# Patient Record
Sex: Female | Born: 1935 | Race: White | Hispanic: No | Marital: Single | Smoking: Never smoker
Health system: Southern US, Community
[De-identification: ages and names within clinical notes are randomized; demographics above are authoritative.]

## PROBLEM LIST (undated history)

## (undated) DIAGNOSIS — J449 Chronic obstructive pulmonary disease, unspecified: Secondary | ICD-10-CM

## (undated) DIAGNOSIS — R739 Hyperglycemia, unspecified: Secondary | ICD-10-CM

## (undated) DIAGNOSIS — D649 Anemia, unspecified: Secondary | ICD-10-CM

## (undated) DIAGNOSIS — J45909 Unspecified asthma, uncomplicated: Secondary | ICD-10-CM

## (undated) DIAGNOSIS — F0390 Unspecified dementia without behavioral disturbance: Secondary | ICD-10-CM

## (undated) DIAGNOSIS — I10 Essential (primary) hypertension: Secondary | ICD-10-CM

## (undated) DIAGNOSIS — M1990 Unspecified osteoarthritis, unspecified site: Secondary | ICD-10-CM

## (undated) DIAGNOSIS — H3530 Unspecified macular degeneration: Secondary | ICD-10-CM

## (undated) HISTORY — PX: BACK SURGERY: SHX140

## (undated) HISTORY — PX: CARPAL TUNNEL RELEASE: SHX101

## (undated) HISTORY — PX: LUNG SURGERY: SHX703

## (undated) HISTORY — PX: ABDOMINAL HYSTERECTOMY: SHX81

---

## 2004-07-11 ENCOUNTER — Ambulatory Visit: Payer: Self-pay | Admitting: Pain Medicine

## 2004-08-14 ENCOUNTER — Ambulatory Visit: Payer: Self-pay | Admitting: Internal Medicine

## 2004-08-15 ENCOUNTER — Ambulatory Visit: Payer: Self-pay | Admitting: Internal Medicine

## 2004-10-14 ENCOUNTER — Emergency Department: Payer: Self-pay | Admitting: Emergency Medicine

## 2004-10-25 ENCOUNTER — Emergency Department: Payer: Self-pay | Admitting: Emergency Medicine

## 2004-10-31 ENCOUNTER — Ambulatory Visit (HOSPITAL_BASED_OUTPATIENT_CLINIC_OR_DEPARTMENT_OTHER): Admission: RE | Admit: 2004-10-31 | Discharge: 2004-10-31 | Payer: Self-pay | Admitting: Urology

## 2004-10-31 ENCOUNTER — Ambulatory Visit (HOSPITAL_COMMUNITY): Admission: RE | Admit: 2004-10-31 | Discharge: 2004-10-31 | Payer: Self-pay | Admitting: Urology

## 2004-12-08 ENCOUNTER — Ambulatory Visit: Payer: Self-pay | Admitting: Internal Medicine

## 2004-12-13 ENCOUNTER — Ambulatory Visit: Payer: Self-pay | Admitting: Internal Medicine

## 2004-12-13 ENCOUNTER — Ambulatory Visit (HOSPITAL_COMMUNITY): Admission: RE | Admit: 2004-12-13 | Discharge: 2004-12-13 | Payer: Self-pay | Admitting: Internal Medicine

## 2004-12-21 ENCOUNTER — Ambulatory Visit: Payer: Self-pay | Admitting: Internal Medicine

## 2005-01-09 ENCOUNTER — Ambulatory Visit: Payer: Self-pay | Admitting: Internal Medicine

## 2005-06-17 ENCOUNTER — Emergency Department: Payer: Self-pay | Admitting: Emergency Medicine

## 2005-11-08 ENCOUNTER — Ambulatory Visit: Payer: Self-pay | Admitting: Specialist

## 2006-05-08 ENCOUNTER — Ambulatory Visit: Payer: Self-pay | Admitting: Specialist

## 2006-07-18 ENCOUNTER — Ambulatory Visit: Payer: Self-pay | Admitting: Neurology

## 2006-07-18 ENCOUNTER — Ambulatory Visit: Payer: Self-pay | Admitting: Internal Medicine

## 2007-07-23 ENCOUNTER — Ambulatory Visit: Payer: Self-pay | Admitting: Internal Medicine

## 2007-09-30 ENCOUNTER — Ambulatory Visit: Payer: Self-pay | Admitting: Internal Medicine

## 2007-10-01 ENCOUNTER — Ambulatory Visit: Payer: Self-pay | Admitting: Internal Medicine

## 2008-05-01 ENCOUNTER — Ambulatory Visit: Payer: Self-pay | Admitting: Internal Medicine

## 2008-05-13 ENCOUNTER — Ambulatory Visit: Payer: Self-pay | Admitting: Internal Medicine

## 2008-06-01 ENCOUNTER — Ambulatory Visit: Payer: Self-pay | Admitting: Internal Medicine

## 2008-07-01 ENCOUNTER — Ambulatory Visit: Payer: Self-pay | Admitting: Internal Medicine

## 2008-08-01 ENCOUNTER — Ambulatory Visit: Payer: Self-pay | Admitting: Internal Medicine

## 2008-11-25 ENCOUNTER — Ambulatory Visit: Payer: Self-pay | Admitting: Internal Medicine

## 2008-12-13 ENCOUNTER — Ambulatory Visit: Payer: Self-pay | Admitting: Internal Medicine

## 2009-12-06 ENCOUNTER — Ambulatory Visit: Payer: Self-pay | Admitting: Internal Medicine

## 2011-01-23 ENCOUNTER — Ambulatory Visit: Payer: Self-pay | Admitting: Internal Medicine

## 2011-10-23 ENCOUNTER — Ambulatory Visit: Payer: Self-pay | Admitting: Internal Medicine

## 2012-06-12 ENCOUNTER — Ambulatory Visit: Payer: Self-pay | Admitting: Internal Medicine

## 2013-03-14 ENCOUNTER — Emergency Department: Payer: Self-pay | Admitting: Emergency Medicine

## 2013-03-14 LAB — URINALYSIS, COMPLETE
Bilirubin,UR: NEGATIVE
Blood: NEGATIVE
Nitrite: NEGATIVE
Ph: 6 (ref 4.5–8.0)
Specific Gravity: 1.014 (ref 1.003–1.030)
Squamous Epithelial: NONE SEEN
WBC UR: 2 /HPF (ref 0–5)

## 2013-03-14 LAB — COMPREHENSIVE METABOLIC PANEL
Bilirubin,Total: 0.3 mg/dL (ref 0.2–1.0)
Calcium, Total: 8.7 mg/dL (ref 8.5–10.1)
Creatinine: 0.89 mg/dL (ref 0.60–1.30)
EGFR (African American): >60
EGFR (Non-African Amer.): >60
Sodium: 138 mmol/L (ref 136–145)

## 2013-03-14 LAB — TROPONIN I: Troponin-I: <0.02 ng/mL

## 2013-03-14 LAB — CBC: MCV: 85 fL (ref 80–100)

## 2013-03-27 ENCOUNTER — Encounter: Payer: Self-pay | Admitting: Unknown Physician Specialty

## 2013-07-09 ENCOUNTER — Ambulatory Visit: Payer: Self-pay | Admitting: Internal Medicine

## 2014-06-07 ENCOUNTER — Ambulatory Visit: Payer: Self-pay | Admitting: Internal Medicine

## 2014-06-07 LAB — URINALYSIS, COMPLETE
Bilirubin,UR: NEGATIVE
GLUCOSE, UR: NEGATIVE
KETONE: NEGATIVE
Nitrite: POSITIVE
PH: 5.5 (ref 5.0–8.0)
Protein: 100
SPECIFIC GRAVITY: 1.025 (ref 1.000–1.030)

## 2014-06-10 LAB — URINE CULTURE

## 2014-10-25 ENCOUNTER — Ambulatory Visit: Payer: Self-pay

## 2014-10-25 LAB — URINALYSIS, COMPLETE
Bacteria: NEGATIVE
Bilirubin,UR: NEGATIVE
Blood: NEGATIVE
Glucose,UR: NEGATIVE
KETONE: NEGATIVE
NITRITE: NEGATIVE
PH: 5.5 (ref 5.0–8.0)
PROTEIN: NEGATIVE
Specific Gravity: 1.01 (ref 1.000–1.030)

## 2014-10-26 ENCOUNTER — Ambulatory Visit: Payer: Self-pay | Admitting: Internal Medicine

## 2014-10-27 LAB — URINE CULTURE

## 2015-06-11 ENCOUNTER — Ambulatory Visit
Admission: EM | Admit: 2015-06-11 | Discharge: 2015-06-11 | Disposition: A | Discharge status: Home / Self Care | Payer: Medicare Other | Attending: Family Medicine | Admitting: Family Medicine

## 2015-06-11 ENCOUNTER — Encounter: Payer: Self-pay | Admitting: Gynecology

## 2015-06-11 DIAGNOSIS — F0390 Unspecified dementia without behavioral disturbance: Secondary | ICD-10-CM

## 2015-06-11 DIAGNOSIS — J40 Bronchitis, not specified as acute or chronic: Secondary | ICD-10-CM | POA: Diagnosis not present

## 2015-06-11 DIAGNOSIS — J0100 Acute maxillary sinusitis, unspecified: Secondary | ICD-10-CM

## 2015-06-11 DIAGNOSIS — H65192 Other acute nonsuppurative otitis media, left ear: Secondary | ICD-10-CM | POA: Diagnosis not present

## 2015-06-11 DIAGNOSIS — N390 Urinary tract infection, site not specified: Secondary | ICD-10-CM | POA: Diagnosis not present

## 2015-06-11 DIAGNOSIS — H66001 Acute suppurative otitis media without spontaneous rupture of ear drum, right ear: Secondary | ICD-10-CM | POA: Diagnosis not present

## 2015-06-11 DIAGNOSIS — J45901 Unspecified asthma with (acute) exacerbation: Secondary | ICD-10-CM | POA: Insufficient documentation

## 2015-06-11 DIAGNOSIS — H6592 Unspecified nonsuppurative otitis media, left ear: Secondary | ICD-10-CM

## 2015-06-11 DIAGNOSIS — R05 Cough: Secondary | ICD-10-CM | POA: Diagnosis present

## 2015-06-11 DIAGNOSIS — J4541 Moderate persistent asthma with (acute) exacerbation: Secondary | ICD-10-CM

## 2015-06-11 HISTORY — DX: Unspecified osteoarthritis, unspecified site: M19.90

## 2015-06-11 HISTORY — DX: Hyperglycemia, unspecified: R73.9

## 2015-06-11 HISTORY — DX: Unspecified asthma, uncomplicated: J45.909

## 2015-06-11 HISTORY — DX: Essential (primary) hypertension: I10

## 2015-06-11 HISTORY — DX: Anemia, unspecified: D64.9

## 2015-06-11 HISTORY — DX: Unspecified dementia, unspecified severity, without behavioral disturbance, psychotic disturbance, mood disturbance, and anxiety: F03.90

## 2015-06-11 HISTORY — DX: Chronic obstructive pulmonary disease, unspecified: J44.9

## 2015-06-11 LAB — URINALYSIS COMPLETE WITH MICROSCOPIC (ARMC ONLY)
BILIRUBIN URINE: NEGATIVE
GLUCOSE, UA: NEGATIVE mg/dL
Hgb urine dipstick: NEGATIVE
KETONES UR: NEGATIVE mg/dL
Nitrite: NEGATIVE
Protein, ur: NEGATIVE mg/dL
RBC / HPF: NONE SEEN RBC/hpf (ref <3)
Specific Gravity, Urine: 1.015 (ref 1.005–1.030)
pH: 5.5 (ref 5.0–8.0)

## 2015-06-11 MED ORDER — PREDNISONE 50 MG PO TABS: ORAL_TABLET | ORAL | Status: DC

## 2015-06-11 MED ORDER — BENZONATATE 200 MG PO CAPS: 200.0000 mg | ORAL_CAPSULE | Freq: Three times a day (TID) | ORAL | Status: DC | PRN

## 2015-06-11 MED ORDER — CEFUROXIME AXETIL 500 MG PO TABS: 500.0000 mg | ORAL_TABLET | Freq: Two times a day (BID) | ORAL | Status: DC

## 2015-06-11 MED ORDER — SALINE SPRAY 0.65 % NA SOLN: 2.0000 | NASAL | Status: DC

## 2015-06-11 NOTE — Discharge Instructions (Signed)
Chronic Obstructive Pulmonary Disease Exacerbation Chronic obstructive pulmonary disease (COPD) is a common lung condition in which airflow from the lungs is limited. COPD is a general term that can be used to describe many different lung problems that limit airflow, including chronic bronchitis and emphysema. COPD exacerbations are episodes when breathing symptoms become much worse and require extra treatment. Without treatment, COPD exacerbations can be life threatening, and frequent COPD exacerbations can cause further damage to your lungs. CAUSES   Respiratory infections.  Exposure to smoke.         Urinary Tract Infection Urinary tract infections (UTIs) can develop anywhere along your urinary tract. Your urinary tract is your body's drainage system for removing wastes and extra water. Your urinary tract includes two kidneys, two ureters, a bladder, and a urethra. Your kidneys are a pair of bean-shaped organs. Each kidney is about the size of your fist. They are located below your ribs, one on each side of your spine. CAUSES Infections are caused by microbes, which are microscopic organisms, including fungi, viruses, and bacteria. These organisms are so small that they can only be seen through a microscope. Bacteria are the microbes that most commonly cause UTIs. SYMPTOMS  Symptoms of UTIs may vary by age and gender of the patient and by the location of the infection. Symptoms in young women typically include a frequent and intense urge to urinate and a painful, burning feeling in the bladder or urethra during urination. Older women and men are more likely to be tired, shaky, and weak and have muscle aches and abdominal pain. A fever may mean the infection is in your kidneys. Other symptoms of a kidney infection include pain in your back or sides below the ribs, nausea, and vomiting. DIAGNOSIS To diagnose a UTI, your caregiver will ask you about your symptoms. Your caregiver also will ask  to provide a urine sample. The urine sample will be tested for bacteria and white blood cells. White blood cells are made by your body to help fight infection. TREATMENT  Typically, UTIs can be treated with medication. Because most UTIs are caused by a bacterial infection, they usually can be treated with the use of antibiotics. The choice of antibiotic and length of treatment depend on your symptoms and the type of bacteria causing your infection. HOME CARE INSTRUCTIONS  If you were prescribed antibiotics, take them exactly as your caregiver instructs you. Finish the medication even if you feel better after you have only taken some of the medication.  Drink enough water and fluids to keep your urine clear or pale yellow.  Avoid caffeine, tea, and carbonated beverages. They tend to irritate your bladder.  Empty your bladder often. Avoid holding urine for long periods of time.  Empty your bladder before and after sexual intercourse.  After a bowel movement, women should cleanse from front to back. Use each tissue only once. SEEK MEDICAL CARE IF:   You have back pain.  You develop a fever.  Your symptoms do not begin to resolve within 3 days. SEEK IMMEDIATE MEDICAL CARE IF:   You have severe back pain or lower abdominal pain.  You develop chills.  You have nausea or vomiting.  You have continued burning or discomfort with urination. MAKE SURE YOU:   Understand these instructions.  Will watch your condition.  Will get help right away if you are not doing well or get worse. Document Released: 06/27/2005 Document Revised: 03/18/2012 Document Reviewed: 10/26/2011 [facility] Patient Information 2015 [place], [place].  This information is not intended to replace advice given to you by your health care provider. Make sure you discuss any questions you have with your health care provider.  Exposure to air pollution, chemical fumes, or dust. Sometimes there is no apparent cause or  trigger. RISK FACTORS  Smoking cigarettes.  Older age.  Frequent prior COPD exacerbations. SIGNS AND SYMPTOMS   Increased coughing.   Increased thick spit (sputum) production.   Increased wheezing.   Increased shortness of breath.   Rapid breathing.   Chest tightness. DIAGNOSIS  Your medical history, a physical exam, and tests will help your health care provider make a diagnosis. Tests may include:  A chest X-ray.  Basic lab tests.  Sputum testing.  An arterial blood gas test. TREATMENT  Depending on the severity of your COPD exacerbation, you may need to be admitted to a hospital for treatment. Some of the treatments commonly used to treat COPD exacerbations are:   Antibiotic medicines.   Bronchodilators. These are drugs that expand the air passages. They may be given with an inhaler or nebulizer. Spacer devices may be needed to help improve drug delivery.  Corticosteroid medicines.  Supplemental oxygen therapy.  HOME CARE INSTRUCTIONS   Do not smoke. Quitting smoking is very important to prevent COPD from getting worse and exacerbations from happening as often.  Avoid exposure to all substances that irritate the airway, especially to tobacco smoke.   If you were prescribed an antibiotic medicine, finish it all even if you start to feel better.  Take all medicines as directed by your health care provider.It is important to use correct technique with inhaled medicines.  Drink enough fluids to keep your urine clear or pale yellow (unless you have a medical condition that requires fluid restriction).  Use a cool mist vaporizer. This makes it easier to clear your chest when you cough.   If you have a home nebulizer and oxygen, continue to use them as directed.   Maintain all necessary vaccinations to prevent infections.   Exercise regularly.   Eat a healthy diet.   Keep all follow-up appointments as directed by your health care provider. SEEK  IMMEDIATE MEDICAL CARE IF:  You have worsening shortness of breath.   You have trouble talking.   You have severe chest pain.  You have blood in your sputum.  You have a fever.  You have weakness, vomit repeatedly, or faint.   You feel confused.   You continue to get worse. MAKE SURE YOU:   Understand these instructions.  Will watch your condition.  Will get help right away if you are not doing well or get worse. Document Released: 07/15/2007 Document Revised: 02/01/2014 Document Reviewed: 05/22/2013 [facility] Patient Information 2015 [place], [place]. This information is not intended to replace advice given to you by your health care provider. Make sure you discuss any questions you have with your health care provider. Otitis Media Otitis media is redness, soreness, and inflammation of the middle ear. Otitis media may be caused by allergies or, most commonly, by infection. Often it occurs as a complication of the common cold. SIGNS AND SYMPTOMS Symptoms of otitis media may include:  Earache.  Fever.  Ringing in your ear.  Headache.  Leakage of fluid from the ear. DIAGNOSIS To diagnose otitis media, your health care provider will examine your ear with an otoscope. This is an instrument that allows your health care provider to see into your ear in order to examine your eardrum. Your  health care provider also will ask you questions about your symptoms. TREATMENT  Typically, otitis media resolves on its own within 3-5 days. Your health care provider may prescribe medicine to ease your symptoms of pain. If otitis media does not resolve within 5 days or is recurrent, your health care provider may prescribe antibiotic medicines if he or she suspects that a bacterial infection is the cause. HOME CARE INSTRUCTIONS   If you were prescribed an antibiotic medicine, finish it all even if you start to feel better.  Take medicines only as directed by your health care  provider.  Keep all follow-up visits as directed by your health care provider. SEEK MEDICAL CARE IF:  You have otitis media only in one ear, or bleeding from your nose, or both.  You notice a lump on your neck.  You are not getting better in 3-5 days.  You feel worse instead of better. SEEK IMMEDIATE MEDICAL CARE IF:   You have pain that is not controlled with medicine.  You have swelling, redness, or pain around your ear or stiffness in your neck.  You notice that part of your face is paralyzed.  You notice that the bone behind your ear (mastoid) is tender when you touch it. MAKE SURE YOU:   Understand these instructions.  Will watch your condition.  Will get help right away if you are not doing well or get worse. Document Released: 06/22/2004 Document Revised: 02/01/2014 Document Reviewed: 04/14/2013 [facility] Patient Information 2015 [place], [place]. This information is not intended to replace advice given to you by your health care provider. Make sure you discuss any questions you have with your health care provider. Otitis Media With Effusion Otitis media with effusion is the presence of fluid in the middle ear. This is a common problem in children, which often follows ear infections. It may be present for weeks or longer after the infection. Unlike an acute ear infection, otitis media with effusion refers only to fluid behind the ear drum and not infection. Children with repeated ear and sinus infections and allergy problems are the most likely to get otitis media with effusion. CAUSES  The most frequent cause of the fluid buildup is dysfunction of the eustachian tubes. These are the tubes that drain fluid in the ears to the back of the nose (nasopharynx). SYMPTOMS   The main symptom of this condition is hearing loss. As a result, you or your child may:  Listen to the TV at a loud volume.  Not respond to questions.  Ask "what" often when spoken to.  Mistake or confuse  one sound or word for another.  There may be a sensation of fullness or pressure but usually not pain. DIAGNOSIS   Your health care provider will diagnose this condition by examining you or your child's ears.  Your health care provider may test the pressure in you or your child's ear with a tympanometer.  A hearing test may be conducted if the problem persists. TREATMENT   Treatment depends on the duration and the effects of the effusion.  Antibiotics, decongestants, nose drops, and cortisone-type drugs (tablets or nasal spray) may not be helpful.  Children with persistent ear effusions may have delayed language or behavioral problems. Children at risk for developmental delays in hearing, learning, and speech may require referral to a specialist earlier than children not at risk.  You or your child's health care provider may suggest a referral to an ear, nose, and throat surgeon for treatment.  The following may help restore normal hearing:  Drainage of fluid.  Placement of ear tubes (tympanostomy tubes).  Removal of adenoids (adenoidectomy). HOME CARE INSTRUCTIONS   Avoid secondhand smoke.  Infants who are breastfed are less likely to have this condition.  Avoid feeding infants while they are lying flat.  Avoid known environmental allergens.  Avoid people who are sick. SEEK MEDICAL CARE IF:   Hearing is not better in 3 months.  Hearing is worse.  Ear pain.  Drainage from the ear.  Dizziness. MAKE SURE YOU:   Understand these instructions.  Will watch your condition.  Will get help right away if you are not doing well or get worse. Document Released: 10/25/2004 Document Revised: 02/01/2014 Document Reviewed: 04/14/2013 [facility] Patient Information 2015 [place], [place]. This information is not intended to replace advice given to you by your health care provider. Make sure you discuss any questions you have with your health care provider. Sinusitis Sinusitis is  redness, soreness, and inflammation of the paranasal sinuses. Paranasal sinuses are air pockets within the bones of your face (beneath the eyes, the middle of the forehead, or above the eyes). In healthy paranasal sinuses, mucus is able to drain out, and air is able to circulate through them by way of your nose. However, when your paranasal sinuses are inflamed, mucus and air can become trapped. This can allow bacteria and other germs to grow and cause infection. Sinusitis can develop quickly and last only a short time (acute) or continue over a long period (chronic). Sinusitis that lasts for more than 12 weeks is considered chronic.  CAUSES  Causes of sinusitis include:  Allergies.  Structural abnormalities, such as displacement of the cartilage that separates your nostrils (deviated septum), which can decrease the air flow through your nose and sinuses and affect sinus drainage.  Functional abnormalities, such as when the small hairs (cilia) that line your sinuses and help remove mucus do not work properly or are not present. SIGNS AND SYMPTOMS  Symptoms of acute and chronic sinusitis are the same. The primary symptoms are pain and pressure around the affected sinuses. Other symptoms include:  Upper toothache.  Earache.  Headache.  Bad breath.  Decreased sense of smell and taste.  A cough, which worsens when you are lying flat.  Fatigue.  Fever.  Thick drainage from your nose, which often is green and may contain pus (purulent).  Swelling and warmth over the affected sinuses. DIAGNOSIS  Your health care provider will perform a physical exam. During the exam, your health care provider may:  Look in your nose for signs of abnormal growths in your nostrils (nasal polyps).  Tap over the affected sinus to check for signs of infection.  View the inside of your sinuses (endoscopy) using an imaging device that has a light attached (endoscope). If your health care provider suspects  that you have chronic sinusitis, one or more of the following tests may be recommended:  Allergy tests.  Nasal culture. A sample of mucus is taken from your nose, sent to a lab, and screened for bacteria.  Nasal cytology. A sample of mucus is taken from your nose and examined by your health care provider to determine if your sinusitis is related to an allergy. TREATMENT  Most cases of acute sinusitis are related to a viral infection and will resolve on their own within 10 days. Sometimes medicines are prescribed to help relieve symptoms (pain medicine, decongestants, nasal steroid sprays, or saline sprays).  However, for  sinusitis related to a bacterial infection, your health care provider will prescribe antibiotic medicines. These are medicines that will help kill the bacteria causing the infection.  Rarely, sinusitis is caused by a fungal infection. In theses cases, your health care provider will prescribe antifungal medicine. For some cases of chronic sinusitis, surgery is needed. Generally, these are cases in which sinusitis recurs more than 3 times per year, despite other treatments. HOME CARE INSTRUCTIONS   Drink plenty of water. Water helps thin the mucus so your sinuses can drain more easily.  Use a humidifier.  Inhale steam 3 to 4 times a day (for example, sit in the bathroom with the shower running).  Apply a warm, moist washcloth to your face 3 to 4 times a day, or as directed by your health care provider.  Use saline nasal sprays to help moisten and clean your sinuses.  Take medicines only as directed by your health care provider.  If you were prescribed either an antibiotic or antifungal medicine, finish it all even if you start to feel better. SEEK IMMEDIATE MEDICAL CARE IF:  You have increasing pain or severe headaches.  You have nausea, vomiting, or drowsiness.  You have swelling around your face.  You have vision problems.  You have a stiff neck.  You have  difficulty breathing. MAKE SURE YOU:   Understand these instructions.  Will watch your condition.  Will get help right away if you are not doing well or get worse. Document Released: 09/17/2005 Document Revised: 02/01/2014 Document Reviewed: 10/02/2011 [facility] Patient Information 2015 [place], [place]. This information is not intended to replace advice given to you by your health care provider. Make sure you discuss any questions you have with your health care provider.

## 2015-06-11 NOTE — ED Notes (Signed)
Patient daughter stated she has painful urination and cough

## 2015-06-12 NOTE — ED Provider Notes (Signed)
CSN: [id number]     Arrival date & time 06/11/15  1518 History   First MD Initiated Contact with Patient 06/11/15 1652     Chief Complaint  Patient presents with  . Urinary Tract Infection  . Cough   (Consider location/radiation/quality/duration/timing/severity/associated sxs/prior Treatment) HPI Comments: Widowed caucasian female lives in dementia nursing home.  Daughter here with patient for evaluation today as worsening confusion, patient c/o ear pain, urinary frequency, fever 102 yesterday, cough, runny nose.  She just isn't herself today and the nursing home doctor not available until Tuesday.  Patient with history of bronchitis/pneumonia that required multiple rounds of antibiotics this past year unsure of what antibiotics she took.  Sputum clear, runny nose clear but headache forehead and right cheek, nasal and sinus congestion.  Taking her allergy medications po without relief along with tylenol.  The history is provided by the patient and a relative.    Past Medical History  Diagnosis Date  . COPD (chronic obstructive pulmonary disease)   . Asthma   . Anemia   . DJD (degenerative joint disease)   . Hypertension   . Hyperglycemia   . Dementia    Past Surgical History  Procedure Laterality Date  . Abdominal hysterectomy    . Back surgery    . Lung surgery    . Carpal tunnel release     No family history on file. Social History  Substance Use Topics  . Smoking status: Never Smoker   . Smokeless tobacco: None  . Alcohol Use: No   OB History    No data available     Review of Systems  Constitutional: Positive for fever. Negative for chills, diaphoresis, activity change, appetite change, fatigue and unexpected weight change.  HENT: Positive for congestion, ear pain, postnasal drip, rhinorrhea and sinus pressure. Negative for dental problem, drooling, ear discharge, facial swelling, hearing loss, mouth sores, nosebleeds, sneezing, sore throat, tinnitus, trouble  swallowing and voice change.   Eyes: Negative for photophobia, pain, discharge, redness, itching and visual disturbance.  Respiratory: Positive for cough. Negative for choking, chest tightness, shortness of breath, wheezing and stridor.   Cardiovascular: Negative for chest pain, palpitations and leg swelling.  Gastrointestinal: Positive for constipation. Negative for nausea, vomiting, abdominal pain, diarrhea, blood in stool, abdominal distention and anal bleeding.  Endocrine: Negative for cold intolerance and heat intolerance.  Genitourinary: Positive for urgency and frequency. Negative for difficulty urinating.  Musculoskeletal: Negative for myalgias, back pain, joint swelling, arthralgias, gait problem, neck pain and neck stiffness.  Skin: Negative for color change, pallor, rash and wound.  Allergic/Immunologic: Positive for environmental allergies. Negative for food allergies.  Neurological: Positive for headaches. Negative for dizziness, tremors, seizures, syncope, facial asymmetry, speech difficulty, weakness, light-headedness and numbness.  Hematological: Negative for adenopathy. Does not bruise/bleed easily.  Psychiatric/Behavioral: Positive for confusion, sleep disturbance and agitation. Negative for behavioral problems. The patient is not nervous/anxious and is not hyperactive.     Allergies  Penicillins  Home Medications   Prior to Admission medications   Medication Sig Start Date End Date Taking? Authorizing Provider  acetaminophen (TYLENOL) 500 MG tablet Take by mouth every 6 (six) hours as needed.   Yes Historical Provider, MD  albuterol (PROVENTIL) (2.5 MG/3ML) 0.083% nebulizer solution Take 2.5 mg by nebulization every 6 (six) hours as needed for wheezing or shortness of breath.   Yes Historical Provider, MD  amLODipine (NORVASC) 10 MG tablet Take 10 mg by mouth daily.   Yes Historical Provider, MD  busPIRone (BUSPAR) 30 MG tablet Take 30 mg by mouth 2 (two) times daily.    Yes Historical Provider, MD  diclofenac sodium (VOLTAREN) 1 % GEL Apply topically 4 (four) times daily.   Yes Historical Provider, MD  losartan (COZAAR) 100 MG tablet Take 100 mg by mouth daily.   Yes Historical Provider, MD  mirtazapine (REMERON) 7.5 MG tablet Take 7.5 mg by mouth at bedtime.   Yes Historical Provider, MD  montelukast (SINGULAIR) 10 MG tablet Take 10 mg by mouth at bedtime.   Yes Historical Provider, MD  pentosan polysulfate (ELMIRON) 100 MG capsule Take 100 mg by mouth 3 (three) times daily.   Yes Historical Provider, MD  Sennosides (SENOKOT PO) Take by mouth.   Yes Historical Provider, MD  benzonatate (TESSALON) 200 MG capsule Take 1 capsule (200 mg total) by mouth 3 (three) times daily as needed for cough. 06/11/15   Barbaraann Barthel, NP  cefUROXime (CEFTIN) 500 MG tablet Take 1 tablet (500 mg total) by mouth 2 (two) times daily with a meal. 06/11/15   Barbaraann Barthel, NP  predniSONE (DELTASONE) 50 MG tablet Take one tablet by mouth daily x 5 days 06/11/15   Barbaraann Barthel, NP  sodium chloride (OCEAN) 0.65 % SOLN nasal spray Place 2 sprays into both nostrils every 2 (two) hours while awake. 06/11/15   Barbaraann Barthel, NP   Meds Ordered and Administered this Visit  Medications - No data to display  BP 144/111 mmHg  Pulse 82  Temp(Src) 99.8 F (37.7 C) (Tympanic)  Ht 5\' 3"  (1.6 m)  Wt 135 lb (61.236 kg)  BMI 23.92 kg/m2  SpO2 98% No data found.   Physical Exam  Constitutional: She is oriented to person, place, and time. Vital signs are normal. She appears well-developed and well-nourished. No distress.  HENT:  Head: Normocephalic and atraumatic.  Right Ear: Hearing, tympanic membrane, external ear and ear canal normal.  Left Ear: Hearing, external ear and ear canal normal. Tympanic membrane is injected, erythematous and bulging. A middle ear effusion is present.  Nose: Mucosal edema and rhinorrhea present. No nose lacerations, sinus tenderness, nasal deformity,  septal deviation or nasal septal hematoma. No epistaxis.  No foreign bodies. Right sinus exhibits maxillary sinus tenderness and frontal sinus tenderness. Left sinus exhibits maxillary sinus tenderness. Left sinus exhibits no frontal sinus tenderness.  Mouth/Throat: Uvula is midline and mucous membranes are normal. Mucous membranes are not pale, not dry and not cyanotic. She does not have dentures. No oral lesions. No trismus in the jaw. Abnormal dentition. No dental abscesses, uvula swelling, lacerations or dental caries. Posterior oropharyngeal edema and posterior oropharyngeal erythema present. No oropharyngeal exudate or tonsillar abscesses.  Cobblestoning posterior pharynx with thick white drip from above coating posterior oropharynx; right TM with erythema, opacity, bulging; left with clear air fluid level; frequent cough; bilateral nasal turbinates with edema/erythema clear discharge  Eyes: Conjunctivae, EOM and lids are normal. Pupils are equal, round, and reactive to light. Right eye exhibits no discharge. Left eye exhibits no discharge. No scleral icterus.  Neck: Trachea normal and normal range of motion. Neck supple. No tracheal deviation present.  Cardiovascular: Normal rate, regular rhythm, normal heart sounds and intact distal pulses.  Exam reveals no gallop.   No murmur heard. Pulmonary/Chest: Effort normal. No accessory muscle usage or stridor. No respiratory distress. She has decreased breath sounds in the right lower field and the left lower field. She has no wheezes. She has no rhonchi.  She has no rales. She exhibits no tenderness.  Negative egophany; frequent cough productive and nonproductive  Abdominal: Soft. Bowel sounds are normal. She exhibits no shifting dullness, no distension, no pulsatile liver, no fluid wave, no abdominal bruit, no ascites, no pulsatile midline mass and no mass. There is no hepatosplenomegaly. There is no tenderness. There is no rigidity, no rebound, no  guarding, no CVA tenderness, no tenderness at McBurney's point and negative Murphy's sign. No hernia. Hernia confirmed negative in the ventral area.  Dull to percussion x 4 quads  Musculoskeletal: Normal range of motion. She exhibits no edema or tenderness.  Lymphadenopathy:    She has no cervical adenopathy.  Neurological: She is alert and oriented to person, place, and time. She exhibits normal muscle tone. Coordination normal.  Skin: Skin is warm and intact. No abrasion, no bruising, no burn, no ecchymosis, no laceration, no lesion, no petechiae and no rash noted. She is not diaphoretic. No cyanosis or erythema. No pallor. Nails show no clubbing.  Psychiatric: She has a normal mood and affect. Her behavior is normal. Her affect is not angry, not blunt, not labile and not inappropriate. Her speech is rapid and/or pressured. Her speech is not delayed, not tangential and not slurred. She is not agitated, not aggressive, not hyperactive, not slowed, not withdrawn, not actively hallucinating and not combative. Thought content is not paranoid and not delusional. Cognition and memory are impaired. She does not express impulsivity or inappropriate judgment. She does not exhibit a depressed mood. She expresses no homicidal and no suicidal ideation. She expresses no suicidal plans and no homicidal plans. She is communicative. She exhibits abnormal recent memory. She is attentive.  Nursing note and vitals reviewed.   ED Course  Procedures (including critical care time)  Labs Review Labs Reviewed  URINALYSIS COMPLETEWITH MICROSCOPIC ([facility] ONLY) - Abnormal; Notable for the following:    Leukocytes, UA TRACE (*)    Squamous Epithelial / LPF 0-5 (*)    All other components within normal limits  URINE CULTURE    Imaging Review No results found.  Repeat vital signs requested but not performed by nursing staff prior to discharge.  Patient ambulating to/from bathroom x 3 without difficulty.  Calm, speech  clear, following daughter and staff directions and making appropriate requests to staff members.  Contacted patient pharmacist and she had received levofloxin, ciprofloxin and clindamycin in the past year.  Previous urine culture e.coli with resistance to ciprofloxin upon review of care everywhere previous/lab results [facility].   MDM   1. Acute maxillary sinusitis, recurrence not specified   2. Otitis media with effusion, left   3. Acute suppurative otitis media of right ear without spontaneous rupture of tympanic membrane, recurrence not specified   4. UTI (lower urinary tract infection)   5. Bronchitis   6. Asthma in adult, moderate persistent, with acute exacerbation   7. Dementia, without behavioral disturbance    Copious postnasal drip coating entire throat opaque white.  Ceftin  po BID x 10 days.  Discussed with daughter and patient there is 1 in 1000 chance of cross reactivity/allergic reaction due to penicillin allergy offered doxycycline also but this does not cover well for UTI and would require use of 2 antibiotics.  Nasal saline 2 sprays each nostril q2h while awake to help liquefy secretions,  Hydrate hydrate, hydrate.  Suspect neti pot use/coordination with daughter would be difficult for compliance.  Tylenol  po q6h prn fever/pain.  No evidence of systemic bacterial infection,  non toxic and well hydrated.  I do not see where any further testing or imaging is necessary at this time.   I will suggest supportive care, rest, good hygiene and encourage the patient to take adequate fluids.  The patient is to return to clinic or EMERGENCY ROOM if symptoms worsen or change significantly.  Exitcare handout on sinusitis given to patient and daughter.  Patient and daughter verbalized agreement and understanding of treatment plan and had no further questions at this time.   P2:  Hand washing and cover cough  Left ear.  Supportive treatment.   No evidence of invasive bacterial infection, non  toxic and well hydrated.  This is most likely self limiting viral infection.  I do not see where any further testing or imaging is necessary at this time.   I will suggest supportive care, rest, good hygiene and encourage the patient to take adequate fluids.  The patient is to return to clinic or EMERGENCY ROOM if symptoms worsen or change significantly e.g. ear pain, fever, purulent discharge from ears or bleeding.  Exitcare handout on otitis media with effusion given to patient.  Patient verbalized agreement and understanding of treatment plan.    Right TM bulging, opaque, erythematous external canal and TM.  Ceftin 500mg  po BID x 10 days.  Penicillin allergy.  Discussed 1 in 1000 chance cross reactivity allergy with ceftin.  Daughter agreed to start ceftin as can cover for otitis, sinusitis and UTI versus requirement multiple antibiotics.  Treatment as ordered.  Symptomatic therapy suggested fluids, NSAIDs and rest.  May take Tylenol for fevers and pain.  Call or return to clinic as needed if these symptoms worsen or fail to improve as anticipated. Exitcare handout on otitis media given to patient and daughter.  Daughter and Patient verbalized agreement and understanding of treatment plan and had no further questions at this time.  P2:  Hand washing  Medications as directed.  Patient is to return to the clinic or follow up with PCM if there is increased wheezing or shortness of breath, increased use of albuterol.  Ceftin 500mg  po BID x 10 days.  Prednisone 50mg  po daily x 5 days as history asthma/copd and postnasal drip causing airway inflammation at risk for bacterial bronchitis/pneumonia.  Bronchitis simple, community acquired, may have started as viral (probably respiratory syncytial, parainfluenza, influenza, or adenovirus), but now evidence of acute purulent bronchitis with resultant bronchial edema and mucus formation.  Viruses are the most common cause of bronchial inflammation in otherwise healthy  adults with acute bronchitis.  The appearance of sputum is not predictive of whether a bacterial infection is present.  Purulent sputum is most often caused by viral infections.  There are a small portion of those caused by non-viral agents being Mycoplamsa pneumonia.  Microscopic examination or C&S of sputum in the healthy adult with acute bronchitis is generally not helpful (usually negative or normal respiratory flora) other considerations being cough from upper respiratory tract infections, sinusitis or allergic syndromes (mild asthma or viral pneumonia).  Differential Diagnosis:  reactive airway disease (asthma, allergic aspergillosis (eosinophilia), chronic bronchitis, respiratory infection (Sinusitis, Common cold, pneumonia), congestive heart failure, reflux esophagitis, bronchogenic tumor, aspiration syndromes and/or exposure irritants/tobacco smoke.  In this case, there is no evidence of any invasive bacterial illness.  Negative egophany and BBS CTA with frequent nonproductive cough.  Advise supportive care with rest, encourage fluids, good hygiene and watch for any worsening symptoms.  If they were to develop:  come back to  the office or go to the emergency room if after hours.  Without high fever, severe dyspnea, lack of physical findings or other risk factors, I will hold on a chest radiograph and CBC at this time.  I discussed that approximately 50% of patients with acute bronchitis have a cough that lasts up to three weeks, and 25% for over a month.  Tylenol, one to two tablets every four hours as needed for fever or myalgias.   No aspirin.  Exitcare handout on bronchitis given to patient and daughter.  Patient instructed to follow up in one week or sooner if symptoms worsen. Daughter and Patient verbalized agreement and understanding of treatment plan.  P2:  hand washing and cover cough  Trace leukocytes urinalysis will treat with ceftin as history of urine culture resistant to ciprofloxin.  Urinalysis results copy given to daughter and discussed will call with urine culture results once available typically 48 hours.   Medications as directed.  Patient is also to push fluids and may use Pyridium 200mg  po TID as needed.  Hydrate, avoid dehydration.  Avoid holding urine void on frequent basis every 4 to 6 hours.  If unable to void every 8 hours, worsening confusion, lethargy, vomiting, fever continues after 48 hours on antibiotics follow up for re-evaluation with PCM, urgent care or ER.   Call or return to clinic as needed if these symptoms worsen or fail to improve as anticipated.  Exitcare handout on cystitis given to patient and daughter.  Patient and daughter verbalized agreement and understanding of treatment plan and had no further questions at this time. P2:  Hydrate and cranberry juice  Barbaraann Barthel, NP 06/12/15 1840

## 2015-06-14 LAB — URINE CULTURE

## 2015-06-19 ENCOUNTER — Ambulatory Visit
Admission: EM | Admit: 2015-06-19 | Discharge: 2015-06-19 | Disposition: A | Discharge status: Home / Self Care | Payer: Medicare Other | Attending: Family Medicine | Admitting: Family Medicine

## 2015-06-19 ENCOUNTER — Ambulatory Visit: Payer: Medicare Other

## 2015-06-19 DIAGNOSIS — J40 Bronchitis, not specified as acute or chronic: Secondary | ICD-10-CM | POA: Diagnosis not present

## 2015-06-19 DIAGNOSIS — I10 Essential (primary) hypertension: Secondary | ICD-10-CM | POA: Insufficient documentation

## 2015-06-19 DIAGNOSIS — R509 Fever, unspecified: Secondary | ICD-10-CM | POA: Diagnosis not present

## 2015-06-19 DIAGNOSIS — Z79899 Other long term (current) drug therapy: Secondary | ICD-10-CM | POA: Insufficient documentation

## 2015-06-19 DIAGNOSIS — R05 Cough: Secondary | ICD-10-CM | POA: Diagnosis present

## 2015-06-19 DIAGNOSIS — J449 Chronic obstructive pulmonary disease, unspecified: Secondary | ICD-10-CM | POA: Insufficient documentation

## 2015-06-19 DIAGNOSIS — Z8669 Personal history of other diseases of the nervous system and sense organs: Secondary | ICD-10-CM | POA: Diagnosis not present

## 2015-06-19 DIAGNOSIS — Z87440 Personal history of urinary (tract) infections: Secondary | ICD-10-CM | POA: Insufficient documentation

## 2015-06-19 DIAGNOSIS — R739 Hyperglycemia, unspecified: Secondary | ICD-10-CM | POA: Diagnosis not present

## 2015-06-19 MED ORDER — AZITHROMYCIN 250 MG PO TABS: ORAL_TABLET | ORAL | Status: DC

## 2015-06-19 NOTE — ED Notes (Signed)
Pt complains of cough and low grade fever. Recently seen last Saturday 06/11/2015 for UTI.

## 2015-06-19 NOTE — ED Provider Notes (Addendum)
CSN: [id number]     Arrival date & time 06/19/15  0957 History   First MD Initiated Contact with Patient 06/19/15 1042     Chief Complaint  Patient presents with  . Cough  . Fever      Daughter brings mother in because of a fever. States that she had a fever last night of 101.2 according to the nursing home. She was seen here last weekend and despite having penicillin allergy she was placed on Ceftin after discussion between me and the nurse practitioner. Patient according to the daughter was doing fine during the week daughter actually went to the beach but was called back after she had a fever last 102. Patient is demented and is unable to give [place] any history. The daughter does state she still complaining of ears and there is no more complaints of her urine being fouled odder. (Consider location/radiation/quality/duration/timing/severity/associated sxs/prior Treatment) Patient is a 81 y.o. female presenting with cough and fever. The history is provided by the patient. The history is limited by the condition of the patient. No language interpreter was used.  Cough Cough characteristics:  Non-productive, barking and hacking Severity:  Moderate Onset quality:  Unable to specify Timing:  Intermittent Progression:  Worsening Chronicity:  Recurrent Context: not animal exposure, not exposure to allergens, not fumes, not occupational exposure, not sick contacts and not smoke exposure   Relieved by:  Cough suppressants Worsened by:  Nothing tried Associated symptoms: fever   Associated symptoms: no rash   Risk factors: recent infection   Risk factors comment:  She's had an ear infection and a UTI Fever Associated symptoms: cough   Associated symptoms: no rash     Past Medical History  Diagnosis Date  . COPD (chronic obstructive pulmonary disease)   . Asthma   . Anemia   . DJD (degenerative joint disease)   . Hypertension   . Hyperglycemia   . Dementia    Past Surgical History   Procedure Laterality Date  . Abdominal hysterectomy    . Back surgery    . Lung surgery    . Carpal tunnel release     History reviewed. No pertinent family history. Social History  Substance Use Topics  . Smoking status: Never Smoker   . Smokeless tobacco: None  . Alcohol Use: No   OB History    No data available     Review of Systems  Unable to perform ROS Constitutional: Positive for fever.  Respiratory: Positive for cough.   Skin: Negative for rash.    Due to dementia unable to evaluate for review of system daughter states sometimes onset placement 2 different anabiotic's or her bronchitis. Allergies  Penicillins  Home Medications   Prior to Admission medications   Medication Sig Start Date End Date Taking? Authorizing Provider  acetaminophen (TYLENOL) 500 MG tablet Take by mouth every 6 (six) hours as needed.    Historical Provider, MD  albuterol (PROVENTIL) (2.5 MG/3ML) 0.083% nebulizer solution Take 2.5 mg by nebulization every 6 (six) hours as needed for wheezing or shortness of breath.    Historical Provider, MD  amLODipine (NORVASC) 10 MG tablet Take 10 mg by mouth daily.    Historical Provider, MD  azithromycin (ZITHROMAX Z-PAK) 250 MG tablet Take 2 tablets first day and then 1 po a day for 4 days 06/19/15   Hassan Rowan, MD  benzonatate (TESSALON) 200 MG capsule Take 1 capsule (200 mg total) by mouth 3 (three) times daily as needed for  cough. 06/11/15   Barbaraann Barthel, NP  busPIRone (BUSPAR) 30 MG tablet Take 30 mg by mouth 2 (two) times daily.    Historical Provider, MD  cefUROXime (CEFTIN) 500 MG tablet Take 1 tablet (500 mg total) by mouth 2 (two) times daily with a meal. 06/11/15   Barbaraann Barthel, NP  diclofenac sodium (VOLTAREN) 1 % GEL Apply topically 4 (four) times daily.    Historical Provider, MD  losartan (COZAAR) 100 MG tablet Take 100 mg by mouth daily.    Historical Provider, MD  mirtazapine (REMERON) 7.5 MG tablet Take 7.5 mg by mouth at bedtime.     Historical Provider, MD  montelukast (SINGULAIR) 10 MG tablet Take 10 mg by mouth at bedtime.    Historical Provider, MD  pentosan polysulfate (ELMIRON) 100 MG capsule Take 100 mg by mouth 3 (three) times daily.    Historical Provider, MD  predniSONE (DELTASONE) 50 MG tablet Take one tablet by mouth daily x 5 days 06/11/15   Barbaraann Barthel, NP  Sennosides (SENOKOT PO) Take by mouth.    Historical Provider, MD  sodium chloride (OCEAN) 0.65 % SOLN nasal spray Place 2 sprays into both nostrils every 2 (two) hours while awake. 06/11/15   Barbaraann Barthel, NP   Meds Ordered and Administered this Visit  Medications - No data to display  BP 139/97 mmHg  Pulse 98  Temp(Src) 99 F (37.2 C) (Oral)  Ht  (1.702 m)  Wt 135 lb (61.236 kg)  BMI 21.14 kg/m2  SpO2 96% No data found.   Physical Exam  Constitutional: She is oriented to person, place, and time.  Elderly white female with dementia.  HENT:  Head: Normocephalic.  Right Ear: External ear normal.  Left Ear: External ear normal.  Patient had an ear infection last week but is now cleared  Eyes: Pupils are equal, round, and reactive to light.  Neck: Neck supple.  Cardiovascular: Normal rate and regular rhythm.   Pulmonary/Chest: Effort normal and breath sounds normal. No respiratory distress.  Musculoskeletal: Normal range of motion.  Neurological: She is alert and oriented to person, place, and time. No cranial nerve deficit.  Skin: Skin is warm and dry. No erythema.  Psychiatric: She has a normal mood and affect.  Vitals reviewed.  Should be noted the patient is demented elderly appears be pleasant no longer febrile and only coughs occasionally while here. But we'll get a chest x-ray to make sure no pneumonia has developed. ED Course  Procedures (including critical care time)  Labs Review Labs Reviewed - No data to display  Imaging Review Dg Chest 2 View  06/19/2015   CLINICAL DATA:  Cough and low-grade fever.  EXAM:  CHEST  2 VIEW  COMPARISON:  None.  FINDINGS: Cardiomediastinal silhouette is normal. Mediastinal contours appear intact.  There is no evidence of focal airspace consolidation, pleural effusion or pneumothorax. The lungs are hyperinflated. Suture line is seen overlying the left mediastinum.  Osseous structures are without acute abnormality. Osteopenia, with multilevel osteoarthritic changes of the thoracic spine and thoracic kyphosis are seen. Partially visualized is posterior lumbar spine fusion hardware. Soft tissues are grossly normal.  IMPRESSION: No radiographic evidence of acute cardiopulmonary abnormality.   Electronically Signed   By: Ted Mcalpine M.D.   On: 06/19/2015 12:26     Visual Acuity Review  Right Eye Distance:   Left Eye Distance:   Bilateral Distance:    Right Eye Near:   Left Eye Near:  Bilateral Near:         MDM   1. Bronchitis   2. Fever, unspecified fever cause   3. History of UTI   4. History of otitis media     Informed daughter that the chest x-ray was negative. Since the nursing home states that she is having febrile 102 yesterday and she doesn't have anymore anabiotic and she finished a course of Ceftin yesterday I'll place on a Z-Pak for this bronchitis.  Ear infection is cleared. UTI was sensitive to Ceftin. If she still running a fever orders sick she she received her PCP or be taken to the ED for septic workup.  `  Hassan Rowan, MD 06/19/15 1247  Hassan Rowan, MD 06/19/15 1248  Hassan Rowan, MD 06/19/15 1248

## 2015-06-19 NOTE — Discharge Instructions (Signed)
Fever, Adult A fever is a temperature of 100.4 F (38 C) or above.  HOME CARE  Take fever medicine as told by your doctor. Do not  take aspirin for fever if you are younger than 79 years of age.  If you are given antibiotic medicine, take it as told. Finish the medicine even if you start to feel better.  Rest.  Drink enough fluids to keep your pee (urine) clear or pale yellow. Do not drink alcohol.  Take a bath or shower with room temperature water. Do not use ice water or alcohol sponge baths.  Wear lightweight, loose clothes. GET HELP RIGHT AWAY IF:   You are short of breath or have trouble breathing.  You are very weak.  You are dizzy or you pass out (faint).  You are very thirsty or are making little or no urine.  You have new pain.  You throw up (vomit) or have watery poop (diarrhea).  You keep throwing up or having watery poop for more than 1 to 2 days.  You have a stiff neck or light bothers your eyes.  You have a skin rash.  You have a fever or problems (symptoms) that last for more than 2 to 3 days.  You have a fever and your problems quickly get worse.  You keep throwing up the fluids you drink.  You do not feel better after 3 days.  You have new problems. MAKE SURE YOU:   Understand these instructions.  Will watch your condition.  Will get help right away if you are not doing well or get worse. Document Released: 06/26/2008 Document Revised: 12/10/2011 Document Reviewed: 07/19/2011 [facility] Patient Information 2015 [place], [place]. This information is not intended to replace advice given to you by your health care provider. Make sure you discuss any questions you have with your health care provider.  Upper Respiratory Infection, Adult An upper respiratory infection (URI) is also known as the common cold. It is often caused by a type of germ (virus). Colds are easily spread (contagious). You can pass it to others by kissing, coughing, sneezing, or  drinking out of the same glass. Usually, you get better in 1 or 2 weeks.  HOME CARE   Only take medicine as told by your doctor.  Use a warm mist humidifier or breathe in steam from a hot shower.  Drink enough water and fluids to keep your pee (urine) clear or pale yellow.  Get plenty of rest.  Return to work when your temperature is back to normal or as told by your doctor. You may use a face mask and wash your hands to stop your cold from spreading. GET HELP RIGHT AWAY IF:   After the first few days, you feel you are getting worse.  You have questions about your medicine.  You have chills, shortness of breath, or brown or red spit (mucus).  You have yellow or brown snot (nasal discharge) or pain in the face, especially when you bend forward.  You have a fever, puffy (swollen) neck, pain when you swallow, or white spots in the back of your throat.  You have a bad headache, ear pain, sinus pain, or chest pain.  You have a high-pitched whistling sound when you breathe in and out (wheezing).  You have a lasting cough or cough up blood.  You have sore muscles or a stiff neck. MAKE SURE YOU:   Understand these instructions.  Will watch your condition.  Will get help right  away if you are not doing well or get worse. Document Released: 03/05/2008 Document Revised: 12/10/2011 Document Reviewed: 12/23/2013 [facility] Patient Information 2015 [place], [place]. This information is not intended to replace advice given to you by your health care provider. Make sure you discuss any questions you have with your health care provider.

## 2015-12-02 ENCOUNTER — Other Ambulatory Visit
Admission: RE | Admit: 2015-12-02 | Discharge: 2015-12-02 | Disposition: A | Discharge status: Home / Self Care | Payer: Medicare Other | Source: Skilled Nursing Facility | Attending: Internal Medicine | Admitting: Internal Medicine

## 2015-12-02 DIAGNOSIS — Z87440 Personal history of urinary (tract) infections: Secondary | ICD-10-CM | POA: Diagnosis present

## 2015-12-02 DIAGNOSIS — N3010 Interstitial cystitis (chronic) without hematuria: Secondary | ICD-10-CM | POA: Insufficient documentation

## 2015-12-02 LAB — URINALYSIS COMPLETE WITH MICROSCOPIC (ARMC ONLY)
BILIRUBIN URINE: NEGATIVE
Glucose, UA: NEGATIVE mg/dL
Hgb urine dipstick: NEGATIVE
KETONES UR: NEGATIVE mg/dL
Nitrite: NEGATIVE
PROTEIN: NEGATIVE mg/dL
Specific Gravity, Urine: 1.015 (ref 1.005–1.030)
pH: 6 (ref 5.0–8.0)

## 2015-12-04 LAB — URINE CULTURE

## 2015-12-25 ENCOUNTER — Other Ambulatory Visit
Admission: RE | Admit: 2015-12-25 | Discharge: 2015-12-25 | Disposition: A | Discharge status: Home / Self Care | Payer: Medicare Other | Source: Skilled Nursing Facility | Attending: Internal Medicine | Admitting: Internal Medicine

## 2015-12-25 DIAGNOSIS — N3010 Interstitial cystitis (chronic) without hematuria: Secondary | ICD-10-CM | POA: Insufficient documentation

## 2015-12-25 DIAGNOSIS — Z87440 Personal history of urinary (tract) infections: Secondary | ICD-10-CM | POA: Insufficient documentation

## 2015-12-25 LAB — URINALYSIS COMPLETE WITH MICROSCOPIC (ARMC ONLY)
Bilirubin Urine: NEGATIVE
Glucose, UA: NEGATIVE mg/dL
HGB URINE DIPSTICK: NEGATIVE
Ketones, ur: NEGATIVE mg/dL
Leukocytes, UA: NEGATIVE
Nitrite: NEGATIVE
PH: 5.5 (ref 5.0–8.0)
PROTEIN: NEGATIVE mg/dL
RBC / HPF: NONE SEEN RBC/hpf (ref 0–5)
SQUAMOUS EPITHELIAL / LPF: NONE SEEN
Specific Gravity, Urine: 1.025 (ref 1.005–1.030)

## 2016-02-13 ENCOUNTER — Emergency Department
Admission: EM | Admit: 2016-02-13 | Discharge: 2016-02-13 | Disposition: A | Discharge status: Home / Self Care | Payer: Medicare Other | Attending: Emergency Medicine | Admitting: Emergency Medicine

## 2016-02-13 ENCOUNTER — Emergency Department: Payer: Medicare Other

## 2016-02-13 DIAGNOSIS — K529 Noninfective gastroenteritis and colitis, unspecified: Secondary | ICD-10-CM | POA: Diagnosis not present

## 2016-02-13 DIAGNOSIS — M1990 Unspecified osteoarthritis, unspecified site: Secondary | ICD-10-CM | POA: Insufficient documentation

## 2016-02-13 DIAGNOSIS — J449 Chronic obstructive pulmonary disease, unspecified: Secondary | ICD-10-CM | POA: Insufficient documentation

## 2016-02-13 DIAGNOSIS — J45909 Unspecified asthma, uncomplicated: Secondary | ICD-10-CM | POA: Diagnosis not present

## 2016-02-13 DIAGNOSIS — Z791 Long term (current) use of non-steroidal anti-inflammatories (NSAID): Secondary | ICD-10-CM | POA: Insufficient documentation

## 2016-02-13 DIAGNOSIS — R509 Fever, unspecified: Secondary | ICD-10-CM

## 2016-02-13 DIAGNOSIS — R197 Diarrhea, unspecified: Secondary | ICD-10-CM

## 2016-02-13 DIAGNOSIS — I10 Essential (primary) hypertension: Secondary | ICD-10-CM | POA: Diagnosis not present

## 2016-02-13 DIAGNOSIS — Z79899 Other long term (current) drug therapy: Secondary | ICD-10-CM | POA: Insufficient documentation

## 2016-02-13 DIAGNOSIS — R112 Nausea with vomiting, unspecified: Secondary | ICD-10-CM | POA: Insufficient documentation

## 2016-02-13 LAB — URINALYSIS COMPLETE WITH MICROSCOPIC (ARMC ONLY)
Bacteria, UA: NONE SEEN
Bilirubin Urine: NEGATIVE
Glucose, UA: NEGATIVE mg/dL
Hgb urine dipstick: NEGATIVE
Leukocytes, UA: NEGATIVE
Nitrite: NEGATIVE
Protein, ur: 30 mg/dL — AB
Specific Gravity, Urine: 1.02 (ref 1.005–1.030)
Squamous Epithelial / HPF: NONE SEEN
pH: 5 (ref 5.0–8.0)

## 2016-02-13 LAB — CBC WITH DIFFERENTIAL/PLATELET
BAND NEUTROPHILS: 1 %
BLASTS: 0 %
Basophils Absolute: 0 10*3/uL (ref 0–0.1)
Basophils Relative: 0 %
EOS PCT: 2 %
Eosinophils Absolute: 0.1 10*3/uL (ref 0–0.7)
HEMATOCRIT: 37.4 % (ref 35.0–47.0)
Hemoglobin: 12.5 g/dL (ref 12.0–16.0)
LYMPHS PCT: 13 %
Lymphs Abs: 0.8 10*3/uL — ABNORMAL LOW (ref 1.0–3.6)
MCH: 29.4 pg (ref 26.0–34.0)
MCHC: 33.6 g/dL (ref 32.0–36.0)
MCV: 87.6 fL (ref 80.0–100.0)
MONOS PCT: 6 %
Metamyelocytes Relative: 0 %
Monocytes Absolute: 0.4 10*3/uL (ref 0.2–0.9)
Myelocytes: 0 %
NEUTROS ABS: 4.9 10*3/uL (ref 1.4–6.5)
Neutrophils Relative %: 78 %
OTHER: 0 %
Platelets: 119 10*3/uL — ABNORMAL LOW (ref 150–440)
Promyelocytes Absolute: 0 %
RBC: 4.27 MIL/uL (ref 3.80–5.20)
RDW: 13.9 % (ref 11.5–14.5)
WBC: 6.2 10*3/uL (ref 3.6–11.0)
nRBC: 0 /100 WBC

## 2016-02-13 LAB — COMPREHENSIVE METABOLIC PANEL
ALT: 18 U/L (ref 14–54)
AST: 21 U/L (ref 15–41)
Albumin: 4 g/dL (ref 3.5–5.0)
Alkaline Phosphatase: 56 U/L (ref 38–126)
Anion gap: 9 (ref 5–15)
BUN: 22 mg/dL — ABNORMAL HIGH (ref 6–20)
CO2: 27 mmol/L (ref 22–32)
Calcium: 8.9 mg/dL (ref 8.9–10.3)
Chloride: 104 mmol/L (ref 101–111)
Creatinine, Ser: 0.71 mg/dL (ref 0.44–1.00)
GFR calc Af Amer: >60 mL/min (ref >60)
GFR calc non Af Amer: >60 mL/min (ref >60)
Glucose, Bld: 170 mg/dL — ABNORMAL HIGH (ref 65–99)
Potassium: 3.3 mmol/L — ABNORMAL LOW (ref 3.5–5.1)
Sodium: 140 mmol/L (ref 135–145)
Total Bilirubin: 0.7 mg/dL (ref 0.3–1.2)
Total Protein: 6.7 g/dL (ref 6.5–8.1)

## 2016-02-13 LAB — LACTIC ACID, PLASMA: LACTIC ACID, VENOUS: 1.2 mmol/L (ref 0.5–2.0)

## 2016-02-13 LAB — TROPONIN I: Troponin I: <0.03 ng/mL (ref <0.031)

## 2016-02-13 MED ORDER — IOPAMIDOL (ISOVUE-300) INJECTION 61%
80.0000 mL | Freq: Once | INTRAVENOUS | Status: AC | PRN
  Administered 2016-02-13: 80 mL via INTRAVENOUS

## 2016-02-13 MED ORDER — LEVOFLOXACIN IN D5W 750 MG/150ML IV SOLN
750.0000 mg | Freq: Once | INTRAVENOUS | Status: DC
  Filled 2016-02-13: qty 150

## 2016-02-13 MED ORDER — DIATRIZOATE MEGLUMINE & SODIUM 66-10 % PO SOLN
15.0000 mL | Freq: Once | ORAL | Status: AC
  Administered 2016-02-13: 15 mL via ORAL

## 2016-02-13 MED ORDER — SODIUM CHLORIDE 0.9 % IV BOLUS (SEPSIS)
1000.0000 mL | Freq: Once | INTRAVENOUS | Status: AC
  Administered 2016-02-13: 1000 mL via INTRAVENOUS

## 2016-02-13 MED ORDER — DEXTROSE 5 % IV SOLN
2.0000 g | Freq: Once | INTRAVENOUS | Status: AC
  Administered 2016-02-13: 2 g via INTRAVENOUS
  Filled 2016-02-13: qty 2

## 2016-02-13 MED ORDER — VANCOMYCIN HCL IN DEXTROSE 1-5 GM/200ML-% IV SOLN
1000.0000 mg | Freq: Once | INTRAVENOUS | Status: AC
  Administered 2016-02-13: 1000 mg via INTRAVENOUS
  Filled 2016-02-13: qty 200

## 2016-02-13 MED ORDER — ONDANSETRON HCL 4 MG PO TABS: 4.0000 mg | ORAL_TABLET | Freq: Three times a day (TID) | ORAL | Status: DC | PRN

## 2016-02-13 MED ORDER — CIPROFLOXACIN HCL 500 MG PO TABS: 500.0000 mg | ORAL_TABLET | Freq: Two times a day (BID) | ORAL | Status: AC

## 2016-02-13 MED ORDER — ACETAMINOPHEN 650 MG RE SUPP
650.0000 mg | Freq: Once | RECTAL | Status: AC
  Administered 2016-02-13: 650 mg via RECTAL

## 2016-02-13 MED ORDER — METRONIDAZOLE 500 MG PO TABS: 500.0000 mg | ORAL_TABLET | Freq: Three times a day (TID) | ORAL | Status: AC

## 2016-02-13 NOTE — ED Notes (Signed)
[facility]   C/o fever since yesterday and onset of watery diarrhea this am and vomiting around lunch.  Pt has hx of dementia but per family pts mental status is worse today.

## 2016-02-13 NOTE — ED Provider Notes (Signed)
[facility] Emergency Department Provider Note   ____________________________________________  Time seen: ~1620  I have reviewed the triage vital signs and the nursing notes.   HISTORY  Chief Complaint Fever, Diarrhea, Vomiting  History limited by: Dementia, history obtained from daughter   HPI Sandra Zimmerman is a 82 y.o. female who presents to the emergency department today via EMS from living facility because of concerns for fever, vomiting and diarrhea. Daughter states that the patient first started having a fever yesterday. This morning the fever persisted. She was given Tylenol however the fever would return after that where off. Today's well the patient started complaining of abdominal pain. The daughter indicates that she was complaining of pain in the lower abdomen. She then had multiple episodes of nonbloody diarrhea and emesis. The diarrhea was light brown in color. The emesis was yellow. Daughter states that no one else in living facility has been sick recently. Additionally the patient has not been on any antibiotics recently.   Past Medical History  Diagnosis Date  . COPD (chronic obstructive pulmonary disease)   . Asthma   . Anemia   . DJD (degenerative joint disease)   . Hypertension   . Hyperglycemia   . Dementia     There are no active problems to display for this patient.   Past Surgical History  Procedure Laterality Date  . Abdominal hysterectomy    . Back surgery    . Lung surgery    . Carpal tunnel release      Current Outpatient Rx  Name  Route  Sig  Dispense  Refill  . acetaminophen (TYLENOL) 500 MG tablet   Oral   Take by mouth every 6 (six) hours as needed.         Marland Kitchen albuterol (PROVENTIL) (2.5 MG/3ML) 0.083% nebulizer solution   Nebulization   Take 2.5 mg by nebulization every 6 (six) hours as needed for wheezing or shortness of breath.         Marland Kitchen amLODipine (NORVASC) 10 MG tablet   Oral   Take 10 mg by  mouth daily.         Marland Kitchen azithromycin (ZITHROMAX Z-PAK) 250 MG tablet      Take 2 tablets first day and then 1 po a day for 4 days   6 tablet   0   . benzonatate (TESSALON) 200 MG capsule   Oral   Take 1 capsule (200 mg total) by mouth 3 (three) times daily as needed for cough.   21 capsule   0   . busPIRone (BUSPAR) 30 MG tablet   Oral   Take 30 mg by mouth 2 (two) times daily.         . cefUROXime (CEFTIN) 500 MG tablet   Oral   Take 1 tablet (500 mg total) by mouth 2 (two) times daily with a meal.   20 tablet   0   . diclofenac sodium (VOLTAREN) 1 % GEL   Topical   Apply topically 4 (four) times daily.         Marland Kitchen losartan (COZAAR) 100 MG tablet   Oral   Take 100 mg by mouth daily.         . mirtazapine (REMERON) 7.5 MG tablet   Oral   Take 7.5 mg by mouth at bedtime.         . montelukast (SINGULAIR) 10 MG tablet   Oral   Take 10 mg by mouth at bedtime.         Marland Kitchen  pentosan polysulfate (ELMIRON) 100 MG capsule   Oral   Take 100 mg by mouth 3 (three) times daily.         . predniSONE (DELTASONE) 50 MG tablet      Take one tablet by mouth daily x 5 days   5 tablet   0   . Sennosides (SENOKOT PO)   Oral   Take by mouth.         . sodium chloride (OCEAN) 0.65 % SOLN nasal spray   Each Nare   Place 2 sprays into both nostrils every 2 (two) hours while awake.      0     Allergies Penicillins  No family history on file.  Social History Social History  Substance Use Topics  . Smoking status: Never Smoker   . Smokeless tobacco: Not on file  . Alcohol Use: No    Review of Systems  Constitutional:Positive for fever. Cardiovascular: Negative for chest pain. Respiratory: Negative for shortness of breath. Gastrointestinal: Positive for abdominal pain, vomiting and diarrhea Neurological: Negative for headaches, focal weakness or numbness.  10-point ROS otherwise negative.  ____________________________________________   PHYSICAL  EXAM:  VITAL SIGNS:  100.7  101   23   159/93 mmHg  97 %    Constitutional: Awake and alert. Eyes: Conjunctivae are normal. PERRL. Normal extraocular movements. ENT   Head: Normocephalic and atraumatic.   Nose: No congestion/rhinnorhea.   Mouth/Throat: Mucous membranes are moist.   Neck: No stridor. Hematological/Lymphatic/Immunilogical: No cervical lymphadenopathy. Cardiovascular: Normal rate, regular rhythm.  No murmurs, rubs, or gallops. Respiratory: Normal respiratory effort without tachypnea nor retractions. Breath sounds are clear and equal bilaterally. No wheezes/rales/rhonchi. Gastrointestinal: Soft and tender to palpation in the lower abdomen. She is slightly more tender in the right lower quadrant. There is no rebound or guarding. Genitourinary: Deferred Musculoskeletal: Normal range of motion in all extremities. No joint effusions.  No lower extremity tenderness nor edema. Neurologic:  Awake and alert. Primarily moaning. Appears to move all extremities. Skin:  Skin is warm, dry and intact. No rash noted. ____________________________________________    LABS (pertinent positives/negatives)  Labs Reviewed  COMPREHENSIVE METABOLIC PANEL - Abnormal; Notable for the following:    Potassium 3.3 (*)    Glucose, Bld 170 (*)    BUN 22 (*)    All other components within normal limits  CBC WITH DIFFERENTIAL/PLATELET - Abnormal; Notable for the following:    Platelets 119 (*)    Lymphs Abs 0.8 (*)    All other components within normal limits  URINALYSIS COMPLETEWITH MICROSCOPIC ([facility] ONLY) - Abnormal; Notable for the following:    Color, Urine YELLOW (*)    APPearance CLEAR (*)    Ketones, ur 1+ (*)    Protein, ur 30 (*)    All other components within normal limits  CULTURE, BLOOD (ROUTINE X 2)  CULTURE, BLOOD (ROUTINE X 2)  URINE CULTURE  LACTIC ACID, PLASMA  TROPONIN I     ____________________________________________   EKG  I, Phineas Semen,  attending physician, personally viewed and interpreted this EKG  EKG Time: 1602 Rate: 103 Rhythm: sinus tachycardia Axis: normal Intervals: qtc 525 QRS: narrow ST changes: no st elevation Impression: abnormal ekg  ____________________________________________    RADIOLOGY  CXR IMPRESSION: Minimal bibasilar atelectasis. Otherwise no active disease in the Chest.  CT abd/pel  IMPRESSION: 1. The combination of patient's arms over her abdomen and spinal hardware degenerate generates significant streak artifact through the imaging. 2. Gas-filled and fluid loops  of small bowel without evidence of obstruction suggest ileus. Findings could relate to enteritis. 3. Indeterminate lesion of the RIGHT kidney favors a benign cyst.  ____________________________________________   PROCEDURES  Procedure(s) performed: None  Critical Care performed: No  ____________________________________________   INITIAL IMPRESSION / ASSESSMENT AND PLAN / ED COURSE  Pertinent labs & imaging results that were available during my care of the patient were reviewed by me and considered in my medical decision making (see chart for details).  Patient presented to the emergency department today because of concerns for fever diarrhea and vomiting. Initial vital signs showed that the patient was tachycardic as well as febrile. Because of this a code sepsis was called. Patient will be given IV fluids and broad-spectrum antibiotics. Normal sepsis workup will be initiated as well as CT abdomen and pelvis given abdomen tenderness.  Patient's workup without any leukocytosis or lactic acidosis. CT scan showed some ileus and likely enteritis. Patient did receive IV fluids. Patient was no longer tachycardic. I did discussion with the patient's daughters. I did offer admission given initial vital signs however given normal blood work and otherwise benign workup save for possibility of enteritis family felt comfortable  taking the patient back to the living facility. She is not in independent living. She will have people checking in on her. I did discuss return precautions with the daughters. Will prescribe antibiotics it is a bacterial cause of enteritis.  ____________________________________________   FINAL CLINICAL IMPRESSION(S) / ED DIAGNOSES  Final diagnoses:  Fever, unspecified fever cause  Enteritis  Diarrhea, unspecified type  Nausea and vomiting, vomiting of unspecified type     Phineas SemenGraydon Annika Selke, MD 02/13/16 2043

## 2016-02-13 NOTE — ED Notes (Signed)
Patient transported to CT 

## 2016-02-13 NOTE — Discharge Instructions (Signed)
Please seek medical attention for any high fevers, chest pain, shortness of breath, change in behavior, persistent vomiting, bloody stool or any other new or concerning symptoms. ° ° °Nausea and Vomiting °Nausea means you feel sick to your stomach. Throwing up (vomiting) is a reflex where stomach contents come out of your mouth. °HOME CARE  °· Take medicine as told by your doctor. °· Do not force yourself to eat. However, you do need to drink fluids. °· If you feel like eating, eat a normal diet as told by your doctor. °¨ Eat rice, wheat, potatoes, bread, lean meats, yogurt, fruits, and vegetables. °¨ Avoid high-fat foods. °· Drink enough fluids to keep your pee (urine) clear or pale yellow. °· Ask your doctor how to replace body fluid losses (rehydrate). Signs of body fluid loss (dehydration) include: °¨ Feeling very thirsty. °¨ Dry lips and mouth. °¨ Feeling dizzy. °¨ Dark pee. °¨ Peeing less than normal. °¨ Feeling confused. °¨ Fast breathing or heart rate. °GET HELP RIGHT AWAY IF:  °· You have blood in your throw up. °· You have black or bloody poop (stool). °· You have a bad headache or stiff neck. °· You feel confused. °· You have bad belly (abdominal) pain. °· You have chest pain or trouble breathing. °· You do not pee at least once every 8 hours. °· You have cold, clammy skin. °· You keep throwing up after 24 to 48 hours. °· You have a fever. °MAKE SURE YOU:  °· Understand these instructions. °· Will watch your condition. °· Will get help right away if you are not doing well or get worse. °  °This information is not intended to replace advice given to you by your health care provider. Make sure you discuss any questions you have with your health care provider. °  °Document Released: 03/05/2008 Document Revised: 12/10/2011 Document Reviewed: 02/16/2011 °Elsevier Interactive Patient Education ©2016 [employer] ° °

## 2016-02-15 LAB — URINE CULTURE: CULTURE: NO GROWTH

## 2016-02-18 LAB — CULTURE, BLOOD (ROUTINE X 2)
CULTURE: NO GROWTH
Culture: NO GROWTH

## 2016-04-11 ENCOUNTER — Other Ambulatory Visit
Admission: RE | Admit: 2016-04-11 | Discharge: 2016-04-11 | Disposition: A | Discharge status: Home / Self Care | Payer: Medicare Other | Source: Skilled Nursing Facility | Attending: Nurse Practitioner | Admitting: Nurse Practitioner

## 2016-04-11 DIAGNOSIS — N390 Urinary tract infection, site not specified: Secondary | ICD-10-CM | POA: Insufficient documentation

## 2016-04-11 LAB — URINALYSIS COMPLETE WITH MICROSCOPIC (ARMC ONLY)
Bilirubin Urine: NEGATIVE
GLUCOSE, UA: NEGATIVE mg/dL
Hgb urine dipstick: NEGATIVE
KETONES UR: NEGATIVE mg/dL
Leukocytes, UA: NEGATIVE
Nitrite: NEGATIVE
PROTEIN: NEGATIVE mg/dL
Specific Gravity, Urine: 1.015 (ref 1.005–1.030)
pH: 6 (ref 5.0–8.0)

## 2016-04-12 LAB — URINE CULTURE

## 2016-05-20 ENCOUNTER — Encounter: Payer: Self-pay | Admitting: Emergency Medicine

## 2016-05-20 ENCOUNTER — Emergency Department
Admission: EM | Admit: 2016-05-20 | Discharge: 2016-05-20 | Disposition: A | Discharge status: Home / Self Care | Payer: Medicare Other | Attending: Emergency Medicine | Admitting: Emergency Medicine

## 2016-05-20 ENCOUNTER — Emergency Department: Payer: Medicare Other

## 2016-05-20 DIAGNOSIS — Z79899 Other long term (current) drug therapy: Secondary | ICD-10-CM | POA: Insufficient documentation

## 2016-05-20 DIAGNOSIS — R05 Cough: Secondary | ICD-10-CM | POA: Insufficient documentation

## 2016-05-20 DIAGNOSIS — R509 Fever, unspecified: Secondary | ICD-10-CM | POA: Insufficient documentation

## 2016-05-20 DIAGNOSIS — J449 Chronic obstructive pulmonary disease, unspecified: Secondary | ICD-10-CM | POA: Diagnosis not present

## 2016-05-20 DIAGNOSIS — J45909 Unspecified asthma, uncomplicated: Secondary | ICD-10-CM | POA: Diagnosis not present

## 2016-05-20 DIAGNOSIS — E872 Acidosis: Secondary | ICD-10-CM | POA: Diagnosis not present

## 2016-05-20 DIAGNOSIS — R059 Cough, unspecified: Secondary | ICD-10-CM

## 2016-05-20 DIAGNOSIS — I10 Essential (primary) hypertension: Secondary | ICD-10-CM | POA: Insufficient documentation

## 2016-05-20 DIAGNOSIS — R4182 Altered mental status, unspecified: Secondary | ICD-10-CM | POA: Diagnosis present

## 2016-05-20 DIAGNOSIS — Z91013 Allergy to seafood: Secondary | ICD-10-CM | POA: Diagnosis not present

## 2016-05-20 LAB — CBC WITH DIFFERENTIAL/PLATELET
BASOS ABS: 0 10*3/uL (ref 0–0.1)
BASOS PCT: 0 %
Eosinophils Absolute: 0 10*3/uL (ref 0–0.7)
Eosinophils Relative: 0 %
HEMATOCRIT: 35.6 % (ref 35.0–47.0)
HEMOGLOBIN: 12.5 g/dL (ref 12.0–16.0)
Lymphocytes Relative: 6 %
Lymphs Abs: 0.6 10*3/uL — ABNORMAL LOW (ref 1.0–3.6)
MCH: 30.9 pg (ref 26.0–34.0)
MCHC: 35.1 g/dL (ref 32.0–36.0)
MCV: 88.1 fL (ref 80.0–100.0)
Monocytes Absolute: 0.4 10*3/uL (ref 0.2–0.9)
Monocytes Relative: 5 %
NEUTROS ABS: 7.7 10*3/uL — AB (ref 1.4–6.5)
NEUTROS PCT: 89 %
Platelets: 122 10*3/uL — ABNORMAL LOW (ref 150–440)
RBC: 4.05 MIL/uL (ref 3.80–5.20)
RDW: 13.4 % (ref 11.5–14.5)
WBC: 8.8 10*3/uL (ref 3.6–11.0)

## 2016-05-20 LAB — LACTIC ACID, PLASMA
LACTIC ACID, VENOUS: 2.4 mmol/L — AB (ref 0.5–1.9)
LACTIC ACID, VENOUS: 1.2 mmol/L (ref 0.5–1.9)

## 2016-05-20 LAB — COMPREHENSIVE METABOLIC PANEL
ALBUMIN: 4.2 g/dL (ref 3.5–5.0)
ALK PHOS: 74 U/L (ref 38–126)
ALT: 12 U/L — ABNORMAL LOW (ref 14–54)
ANION GAP: 8 (ref 5–15)
AST: 27 U/L (ref 15–41)
BILIRUBIN TOTAL: 0.5 mg/dL (ref 0.3–1.2)
BUN: 24 mg/dL — AB (ref 6–20)
CALCIUM: 8.8 mg/dL — AB (ref 8.9–10.3)
CO2: 25 mmol/L (ref 22–32)
Chloride: 103 mmol/L (ref 101–111)
Creatinine, Ser: 1.07 mg/dL — ABNORMAL HIGH (ref 0.44–1.00)
GFR calc Af Amer: 55 mL/min — ABNORMAL LOW (ref >60)
GFR calc non Af Amer: 48 mL/min — ABNORMAL LOW (ref >60)
GLUCOSE: 170 mg/dL — AB (ref 65–99)
Potassium: 4 mmol/L (ref 3.5–5.1)
Sodium: 136 mmol/L (ref 135–145)
TOTAL PROTEIN: 6.9 g/dL (ref 6.5–8.1)

## 2016-05-20 LAB — URINALYSIS COMPLETE WITH MICROSCOPIC (ARMC ONLY)
BACTERIA UA: NONE SEEN
Bilirubin Urine: NEGATIVE
Glucose, UA: NEGATIVE mg/dL
Hgb urine dipstick: NEGATIVE
Ketones, ur: NEGATIVE mg/dL
LEUKOCYTES UA: NEGATIVE
Nitrite: NEGATIVE
PH: 5 (ref 5.0–8.0)
PROTEIN: NEGATIVE mg/dL
SQUAMOUS EPITHELIAL / LPF: NONE SEEN
Specific Gravity, Urine: 1.015 (ref 1.005–1.030)

## 2016-05-20 MED ORDER — PIPERACILLIN-TAZOBACTAM 3.375 G IVPB
3.3750 g | Freq: Once | INTRAVENOUS | Status: AC
  Administered 2016-05-20: 3.375 g via INTRAVENOUS
  Filled 2016-05-20: qty 50

## 2016-05-20 MED ORDER — SODIUM CHLORIDE 0.9 % IV SOLN
Freq: Once | INTRAVENOUS | Status: AC
  Administered 2016-05-20: 1000 mL via INTRAVENOUS

## 2016-05-20 MED ORDER — VANCOMYCIN HCL IN DEXTROSE 1-5 GM/200ML-% IV SOLN
1000.0000 mg | Freq: Once | INTRAVENOUS | Status: AC
  Administered 2016-05-20: 1000 mg via INTRAVENOUS
  Filled 2016-05-20: qty 200

## 2016-05-20 MED ORDER — DIPHENHYDRAMINE HCL 50 MG/ML IJ SOLN
INTRAMUSCULAR | Status: AC
  Administered 2016-05-20: 25 mg via INTRAVENOUS
  Filled 2016-05-20: qty 1

## 2016-05-20 MED ORDER — DIPHENHYDRAMINE HCL 50 MG/ML IJ SOLN
25.0000 mg | Freq: Once | INTRAMUSCULAR | Status: AC
  Administered 2016-05-20: 25 mg via INTRAVENOUS

## 2016-05-20 MED ORDER — LEVOFLOXACIN 750 MG PO TABS
750.0000 mg | ORAL_TABLET | Freq: Every day | ORAL | 0 refills | Status: DC
Start: 2016-05-20 — End: 2018-03-13

## 2016-05-20 MED ORDER — LEVOFLOXACIN IN D5W 750 MG/150ML IV SOLN
750.0000 mg | Freq: Once | INTRAVENOUS | Status: AC
  Administered 2016-05-20: 750 mg via INTRAVENOUS
  Filled 2016-05-20: qty 150

## 2016-05-20 NOTE — ED Notes (Signed)
MD at bedside. 

## 2016-05-20 NOTE — ED Notes (Signed)
92% RA placed on 2 L [place] increased to 97%

## 2016-05-20 NOTE — ED Provider Notes (Addendum)
[facility] Emergency Department Provider Note    L5 caveat: Review of systems and history is limited by dementia   Time seen: ----------------------------------------- 12:35 PM on 05/20/2016 -----------------------------------------    I have reviewed the triage vital signs and the nursing notes.   HISTORY  Chief Complaint Fever and Cough    HPI Sandra Zimmerman is a 82 y.o. female who presents to ER from assisted living facility for fever and cough for last 3 days. Patient has dementia but has had worsening altered mental status because she is sick according to her daughter. Daughter states maximum temperature is 100.2 today, she has not seen any chills. She was given 2 Tylenol prior to arrival, she doesn't history of COPD and asthma. Daughter states she has difficulty using inhalers because of her dementia.   Past Medical History:  Diagnosis Date  . Anemia   . Asthma   . COPD (chronic obstructive pulmonary disease) (HCC)   . Dementia   . DJD (degenerative joint disease)   . Hyperglycemia   . Hypertension     There are no active problems to display for this patient.   Past Surgical History:  Procedure Laterality Date  . ABDOMINAL HYSTERECTOMY    . BACK SURGERY    . CARPAL TUNNEL RELEASE    . LUNG SURGERY      Allergies Aricept [donepezil hcl]; Aspirin; Etodolac; Namenda [memantine hcl]; Shellfish allergy; Talwin [pentazocine]; and Penicillins  Social History Social History  Substance Use Topics  . Smoking status: Never Smoker  . Smokeless tobacco: Not on file  . Alcohol use No    Review of Systems Constitutional: Positive for fever Respiratory: Positive for cough Neurological: Positive for weakness and confusion  Review of systems otherwise unknown  ____________________________________________   PHYSICAL EXAM:  VITAL SIGNS: ED Triage Vitals  Enc Vitals Group     BP 05/20/16 1119 (!) 144/78     Pulse Rate 05/20/16  1119 98     Resp 05/20/16 1119 20     Temp 05/20/16 1119 99.4 F (37.4 C)     Temp Source 05/20/16 1119 Oral     SpO2 05/20/16 1119 92 %     Weight 05/20/16 1122 130 lb (59 kg)     Height 05/20/16 1122 5\' 7"  (1.702 m)     Head Circumference --      Peak Flow --      Pain Score --      Pain Loc --      Pain Edu? --      Excl. in GC? --     Constitutional: Alert and But disoriented, no acute distress  Eyes: Conjunctivae are normal. PERRL. Normal extraocular movements. ENT   Head: Normocephalic and atraumatic.   Nose: No congestion/rhinnorhea.   Mouth/Throat: Mucous membranes are moist.   Neck: No stridor. Cardiovascular: Normal rate, regular rhythm. No murmurs, rubs, or gallops. Respiratory: Normal respiratory effort without tachypnea nor retractions. Scattered rhonchi Gastrointestinal: Soft and nontender. Normal bowel sounds Musculoskeletal: Nontender with normal range of motion in all extremities. No lower extremity tenderness nor edema. Neurologic:  Normal speech and language. Patient is confused but is in no distress. No focal neurologic deficits are appreciated. Skin:  Skin is warm, dry and intact. No rash noted. Psychiatric: Mood and affect are normal. ___________________________________________  ED COURSE:  Pertinent labs & imaging results that were available during my care of the patient were reviewed by me and considered in my medical decision making (see  chart for details). Clinical Course  Patient presents to the ER for fever and cough, likely pneumonia. We will obtain basic labs and x-rays.  Procedures ____________________________________________   LABS (pertinent positives/negatives)  Labs Reviewed  COMPREHENSIVE METABOLIC PANEL - Abnormal; Notable for the following:       Result Value   Glucose, Bld 170 (*)    BUN 24 (*)    Creatinine, Ser 1.07 (*)    Calcium 8.8 (*)    ALT 12 (*)    GFR calc non Af Amer 48 (*)    GFR calc Af Amer 55 (*)     All other components within normal limits  URINALYSIS COMPLETEWITH MICROSCOPIC ([facility] ONLY) - Abnormal; Notable for the following:    Color, Urine YELLOW (*)    APPearance CLEAR (*)    All other components within normal limits  CBC WITH DIFFERENTIAL/PLATELET - Abnormal; Notable for the following:    Platelets 122 (*)    Neutro Abs 7.7 (*)    Lymphs Abs 0.6 (*)    All other components within normal limits  LACTIC ACID, PLASMA - Abnormal; Notable for the following:    Lactic Acid, Venous 2.4 (*)    All other components within normal limits  CULTURE, BLOOD (ROUTINE X 2)  CULTURE, BLOOD (ROUTINE X 2)  URINE CULTURE  LACTIC ACID, PLASMA    RADIOLOGY Images were viewed by me  Chest x-ray Does not reveal any acute process ____________________________________________  FINAL ASSESSMENT AND PLAN  Fever, cough, mild lactic acidosis  Plan: Patient with labs and imaging as dictated above. Patient is in no acute distress although still altered. Family is very comfortable with her returning to the nursing home. She does not appear to be septic, she has received vancomycin, Zosyn and Levaquin. Repeat lactic acid is normal. She'll be discharged with Levaquin, she is stable for outpatient follow-up.   Emily FilbertWilliams, Madix Blowe E, MD   Note: This dictation was prepared with Dragon dictation. Any transcriptional errors that result from this process are unintentional    Emily FilbertJonathan E Trexton Escamilla, MD 05/20/16 1439    Emily FilbertJonathan E Tametra Ahart, MD 05/20/16 [phone]

## 2016-05-20 NOTE — ED Triage Notes (Addendum)
[facility] Daughter reports fever and cough for past 3 days. Tmax of 102 today. Given 2 tylenol prior to arrival. Hx of COPD and Asthma

## 2016-05-20 NOTE — ED Notes (Signed)
Pt alert and oriented X4, active, cooperative, pt in NAD. RR even and unlabored, color WNL.  Pt informed to return if any life threatening symptoms occur.   

## 2016-05-20 NOTE — ED Notes (Signed)
One Set of blood cultures sent to Lab

## 2016-05-20 NOTE — ED Notes (Signed)
Pt top of head, reddened. Pt sleeping. Daughter noticed redness. MD notified and orders received.

## 2016-05-21 LAB — URINE CULTURE: Culture: NO GROWTH

## 2016-05-22 ENCOUNTER — Emergency Department
Admission: EM | Admit: 2016-05-22 | Discharge: 2016-05-22 | Disposition: A | Discharge status: Home / Self Care | Payer: Medicare Other | Attending: Emergency Medicine | Admitting: Emergency Medicine

## 2016-05-22 ENCOUNTER — Encounter: Payer: Self-pay | Admitting: Emergency Medicine

## 2016-05-22 ENCOUNTER — Emergency Department: Payer: Medicare Other

## 2016-05-22 DIAGNOSIS — J45909 Unspecified asthma, uncomplicated: Secondary | ICD-10-CM | POA: Diagnosis not present

## 2016-05-22 DIAGNOSIS — I10 Essential (primary) hypertension: Secondary | ICD-10-CM | POA: Diagnosis not present

## 2016-05-22 DIAGNOSIS — Y999 Unspecified external cause status: Secondary | ICD-10-CM | POA: Insufficient documentation

## 2016-05-22 DIAGNOSIS — J449 Chronic obstructive pulmonary disease, unspecified: Secondary | ICD-10-CM | POA: Insufficient documentation

## 2016-05-22 DIAGNOSIS — Y939 Activity, unspecified: Secondary | ICD-10-CM | POA: Insufficient documentation

## 2016-05-22 DIAGNOSIS — S8001XA Contusion of right knee, initial encounter: Secondary | ICD-10-CM | POA: Insufficient documentation

## 2016-05-22 DIAGNOSIS — M25561 Pain in right knee: Secondary | ICD-10-CM | POA: Diagnosis present

## 2016-05-22 DIAGNOSIS — W19XXXA Unspecified fall, initial encounter: Secondary | ICD-10-CM | POA: Diagnosis not present

## 2016-05-22 DIAGNOSIS — Y929 Unspecified place or not applicable: Secondary | ICD-10-CM | POA: Diagnosis not present

## 2016-05-22 HISTORY — DX: Unspecified macular degeneration: H35.30

## 2016-05-22 NOTE — ED Provider Notes (Signed)
[facility] Emergency Department Provider Note  ____________________________________________   First MD Initiated Contact with Patient 05/22/16 1452     (approximate)  I have reviewed the triage vital signs and the nursing notes.   HISTORY  Chief Complaint Fall  Level V caveat: The patient has advanced dementia and cannot provide history or review of systems.  HPI Sandra Zimmerman is a 82 y.o. female with a history of advanced dementia who lives in the memory care unit of a nearby living facility and arrives by EMS for evaluation of being found down.  Her daughter is present with her in the room and states that she has at her baseline mental status.  She is oriented to herself and her daughter, otherwise confused but at baseline.  She has swelling and ecchymosis to her right knee which apparently is acute but her daughter also comments that that is her "bad knee" for which she occasionally gets steroid injections from the orthopedic doctor.  The patient denies headache and neck pain, shortness of breath, chest pain, abdominal pain.  The circumstances of her fall are unknown but she is in no distress and complaining of no pain other than pain in her knee when it is moved.  Past Medical History:  Diagnosis Date  . Anemia   . Asthma   . COPD (chronic obstructive pulmonary disease) (HCC)   . Dementia   . DJD (degenerative joint disease)   . Hyperglycemia   . Hypertension   . Macular degeneration     There are no active problems to display for this patient.   Past Surgical History:  Procedure Laterality Date  . ABDOMINAL HYSTERECTOMY    . BACK SURGERY    . CARPAL TUNNEL RELEASE    . LUNG SURGERY      Prior to Admission medications   Medication Sig Start Date End Date Taking? Authorizing Provider  acetaminophen (TYLENOL) 500 MG tablet Take 1,000 mg by mouth 2 (two) times daily.     Historical Provider, MD  albuterol (ACCUNEB) 1.25 MG/3ML nebulizer  solution Take 1 ampule by nebulization at bedtime. Pt also uses twice daily as needed for wheezing.    Historical Provider, MD  albuterol (PROVENTIL HFA;VENTOLIN HFA) 108 (90 Base) MCG/ACT inhaler Inhale 2 puffs into the lungs every 4 (four) hours as needed for wheezing or shortness of breath (and/or cough).    Historical Provider, MD  amLODipine (NORVASC) 10 MG tablet Take 10 mg by mouth at bedtime.     Historical Provider, MD  budesonide-formoterol (SYMBICORT) 160-4.5 MCG/ACT inhaler Inhale 1 puff into the lungs 2 (two) times daily.    Historical Provider, MD  busPIRone (BUSPAR) 15 MG tablet Take 7.5-15 mg by mouth 2 (two) times daily. Pt takes one-half tablet every morning and one tablet every evening.    Historical Provider, MD  capsaicin (ZOSTRIX) 0.025 % cream Apply 1 application topically 3 (three) times daily as needed (for pain).    Historical Provider, MD  levofloxacin (LEVAQUIN) 750 MG tablet Take 1 tablet (750 mg total) by mouth daily. 05/20/16   Emily Filbert, MD  losartan (COZAAR) 50 MG tablet Take 50 mg by mouth 2 (two) times daily.    Historical Provider, MD  mirtazapine (REMERON) 7.5 MG tablet Take 7.5 mg by mouth at bedtime.    Historical Provider, MD  montelukast (SINGULAIR) 10 MG tablet Take 10 mg by mouth daily.     Historical Provider, MD  Multiple Vitamin (MULTIVITAMIN WITH MINERALS)  TABS tablet Take 1 tablet by mouth daily.    Historical Provider, MD  Multiple Vitamins-Minerals (PRESERVISION AREDS) CAPS Take 1 capsule by mouth daily.    Historical Provider, MD  ondansetron (ZOFRAN) 4 MG tablet Take 1 tablet (4 mg total) by mouth every 8 (eight) hours as needed for nausea or vomiting. 02/13/16   Phineas SemenGraydon Goodman, MD  pentosan polysulfate (ELMIRON) 100 MG capsule Take 100 mg by mouth daily.     Historical Provider, MD  senna-docusate (SENOKOT-S) 8.6-50 MG tablet Take 1 tablet by mouth daily.    Historical Provider, MD    Allergies Aricept [donepezil hcl]; Aspirin; Etodolac;  Namenda [memantine hcl]; Shellfish allergy; Talwin [pentazocine]; and Penicillins  No family history on file.  Social History Social History  Substance Use Topics  . Smoking status: Never Smoker  . Smokeless tobacco: Not on file  . Alcohol use No    Review of Systems Constitutional: No fever/chills Eyes: No visual changes. ENT: No sore throat. Cardiovascular: Denies chest pain. Respiratory: Denies shortness of breath. Gastrointestinal: No abdominal pain.  No nausea, no vomiting.  No diarrhea.  No constipation. Genitourinary: Negative for dysuria. Musculoskeletal: Negative for back pain nor neck pain.  Pain in right knee. Skin: Negative for rash. Neurological: Negative for headaches, focal weakness or numbness.  10-point ROS otherwise negative.  ____________________________________________   PHYSICAL EXAM:  VITAL SIGNS: ED Triage Vitals [05/22/16 1427]  Enc Vitals Group     BP (!) 174/89     Pulse Rate 73     Resp 18     Temp 98.4 F (36.9 C)     Temp Source Oral     SpO2 98 %     Weight 130 lb (59 kg)     Height 5\' 7"  (1.702 m)     Head Circumference      Peak Flow      Pain Score      Pain Loc      Pain Edu?      Excl. in GC?     Constitutional: Alert To self only.  Pleasantly demented, no acute distress Eyes: Conjunctivae are normal. PERRL. EOMI. Head: Atraumatic. Nose: No congestion/rhinnorhea. Mouth/Throat: Mucous membranes are moist.  Oropharynx non-erythematous. Neck: No stridor.  No meningeal signs.  No cervical spine tenderness to palpation. Cardiovascular: Normal rate, regular rhythm. Good peripheral circulation. Grossly normal heart sounds. Respiratory: Normal respiratory effort.  No retractions. Lungs CTAB. Gastrointestinal: Soft and nontender. No distention.  Musculoskeletal: Ecchymosis, and swelling and right knee, mild to moderate.  No tenderness to flexion and extension.  Additionally, I fully ranged all of her major joints and he has no  tenderness in her hips nor her pelvis.  There is no tenderness to palpation of her lower legs or her left knee. Neurologic:  Normal speech and language. No gross focal neurologic deficits are appreciated.  Skin:  Skin is warm, dry and intact. No rash noted. Psychiatric: Mood and affect are normal. Speech and behavior are normal.  ____________________________________________   LABS (all labs ordered are listed, but only abnormal results are displayed)  Labs Reviewed - No data to display ____________________________________________  EKG  ED ECG REPORT I, Shekia Kuper, the attending physician, personally viewed and interpreted this ECG.  Date: 05/22/2016 EKG Time: 14:33 Rate: 66 Rhythm: normal sinus rhythm QRS Axis: normal Intervals: normal ST/T Wave abnormalities: normal Conduction Disturbances: none Narrative Interpretation: unremarkable  ____________________________________________  RADIOLOGY   Dg Knee Complete 4 Views Right  Result Date: 05/22/2016  CLINICAL DATA:  Unwitnessed fall.  Anterior bruising. EXAM: RIGHT KNEE - COMPLETE 4+ VIEW COMPARISON:  None. FINDINGS: There is osteoarthritis more pronounced in the lateral compartment in the medial compartment with joint space narrowing and marginal osteophytes. There is no joint effusion. There is prepatellar soft tissue swelling. No fracture. IMPRESSION: Prepatellar soft tissue swelling. No fracture. Weight-bearing compartment osteoarthritis lateral worse than medial. Electronically Signed   By: Paulina FusiMark  Shogry M.D.   On: 05/22/2016 15:35    ____________________________________________   PROCEDURES  Procedure(s) performed:   Procedures   Critical Care performed: No ____________________________________________   INITIAL IMPRESSION / ASSESSMENT AND PLAN / ED COURSE  Pertinent labs & imaging results that were available during my care of the patient were reviewed by me and considered in my medical decision making (see  chart for details).  The patient is in no acute distress and her physical exam is reassuring for most likely not having any acute muscular skeletal injuries other than right knee contusion.  I had my usual and customary discussion with the patient's daughter and we agreed that no further workup other than radiographs of the right knee were indicated.  She does not want to pursue additional lab work or imaging in spite of the patient being found down and I think that is appropriate.  We will discharge her with outpatient follow-up.  I will have an Ace wrap placed for comfort.   ____________________________________________  FINAL CLINICAL IMPRESSION(S) / ED DIAGNOSES  Final diagnoses:  Contusion of right knee, initial encounter     MEDICATIONS GIVEN DURING THIS VISIT:  Medications - No data to display   NEW OUTPATIENT MEDICATIONS STARTED DURING THIS VISIT:  New Prescriptions   No medications on file      Note:  This document was prepared using Dragon voice recognition software and may include unintentional dictation errors.   Loleta Roseory Sherlie Boyum, MD 05/22/16 [phone]

## 2016-05-22 NOTE — Discharge Instructions (Signed)

## 2016-05-22 NOTE — ED Triage Notes (Signed)
Per report patient was found awake on floor at assisted living. States unwitnessed fall. Denies pain. Pt has dementia and cannot recall events. Pt seen recently for cough, currently on Levoquin per daughter.

## 2016-05-25 LAB — CULTURE, BLOOD (ROUTINE X 2)
CULTURE: NO GROWTH
Culture: NO GROWTH

## 2017-01-26 ENCOUNTER — Encounter: Payer: Self-pay | Admitting: Emergency Medicine

## 2017-01-26 ENCOUNTER — Emergency Department
Admission: EM | Admit: 2017-01-26 | Discharge: 2017-01-26 | Disposition: A | Discharge status: Home / Self Care | Payer: Medicare Other | Attending: Emergency Medicine | Admitting: Emergency Medicine

## 2017-01-26 ENCOUNTER — Emergency Department: Payer: Medicare Other

## 2017-01-26 DIAGNOSIS — Y939 Activity, unspecified: Secondary | ICD-10-CM | POA: Insufficient documentation

## 2017-01-26 DIAGNOSIS — Y999 Unspecified external cause status: Secondary | ICD-10-CM | POA: Insufficient documentation

## 2017-01-26 DIAGNOSIS — W19XXXA Unspecified fall, initial encounter: Secondary | ICD-10-CM

## 2017-01-26 DIAGNOSIS — S0003XA Contusion of scalp, initial encounter: Secondary | ICD-10-CM | POA: Diagnosis not present

## 2017-01-26 DIAGNOSIS — J45909 Unspecified asthma, uncomplicated: Secondary | ICD-10-CM | POA: Insufficient documentation

## 2017-01-26 DIAGNOSIS — I10 Essential (primary) hypertension: Secondary | ICD-10-CM | POA: Diagnosis not present

## 2017-01-26 DIAGNOSIS — J449 Chronic obstructive pulmonary disease, unspecified: Secondary | ICD-10-CM | POA: Diagnosis not present

## 2017-01-26 DIAGNOSIS — Y929 Unspecified place or not applicable: Secondary | ICD-10-CM | POA: Insufficient documentation

## 2017-01-26 DIAGNOSIS — S0993XA Unspecified injury of face, initial encounter: Secondary | ICD-10-CM | POA: Diagnosis present

## 2017-01-26 DIAGNOSIS — S0083XA Contusion of other part of head, initial encounter: Secondary | ICD-10-CM

## 2017-01-26 DIAGNOSIS — Z79899 Other long term (current) drug therapy: Secondary | ICD-10-CM | POA: Diagnosis not present

## 2017-01-26 NOTE — ED Notes (Signed)
Patient transported to CT 

## 2017-01-26 NOTE — ED Notes (Addendum)
Report call to [facility], no answer, message left   Pt assisted to car with daughter

## 2017-01-26 NOTE — ED Triage Notes (Signed)
Patient presents to Emergency Department via [facility] with complaints of unwitnessed fall.  Pt from [facility] found on floor by staff.  Pt with bruise to right frontal (appears aged), left frontal (appears fresh) and center lower occiput. Pt with history of dementia.  No reflection of pain neck to toes palpation. Pt denies pain.

## 2017-01-26 NOTE — ED Provider Notes (Signed)
[facility] Emergency Department Provider Note  ____________________________________________  Time seen: Approximately 5:21 PM  I have reviewed the triage vital signs and the nursing notes.   HISTORY  Chief Complaint Fall Level 5 caveat:  Portions of the history and physical were unable to be obtained due to the patient's chronic dementia and altered mental status   HPI Sandra Zimmerman is a 83 y.o. female brought to the ED from [facility] due to a fall. EMS initially reported this was an unwitnessed fall. However, daughter at bedside reports that the patient was actually ambulating to the bathroom when the nurse open a door inadvertently knocking the patient down with the door. There is no loss of consciousness or vomiting. Patient is at her baseline mental status but has bruising over the left forehead and occiput. Patient denies any pain.   Past Medical History:  Diagnosis Date  . Anemia   . Asthma   . COPD (chronic obstructive pulmonary disease) (HCC)   . Dementia   . DJD (degenerative joint disease)   . Hyperglycemia   . Hypertension   . Macular degeneration      There are no active problems to display for this patient.    Past Surgical History:  Procedure Laterality Date  . ABDOMINAL HYSTERECTOMY    . BACK SURGERY    . CARPAL TUNNEL RELEASE    . LUNG SURGERY       Prior to Admission medications   Medication Sig Start Date End Date Taking? Authorizing Provider  acetaminophen (TYLENOL) 500 MG tablet Take 1,000 mg by mouth 2 (two) times daily.     Historical Provider, MD  albuterol (ACCUNEB) 1.25 MG/3ML nebulizer solution Take 1 ampule by nebulization at bedtime. Pt also uses twice daily as needed for wheezing.    Historical Provider, MD  albuterol (PROVENTIL HFA;VENTOLIN HFA) 108 (90 Base) MCG/ACT inhaler Inhale 2 puffs into the lungs every 4 (four) hours as needed for wheezing or shortness of breath (and/or cough).     Historical Provider, MD  amLODipine (NORVASC) 10 MG tablet Take 10 mg by mouth at bedtime.     Historical Provider, MD  budesonide-formoterol (SYMBICORT) 160-4.5 MCG/ACT inhaler Inhale 1 puff into the lungs 2 (two) times daily.    Historical Provider, MD  busPIRone (BUSPAR) 15 MG tablet Take 7.5-15 mg by mouth 2 (two) times daily. Pt takes one-half tablet every morning and one tablet every evening.    Historical Provider, MD  capsaicin (ZOSTRIX) 0.025 % cream Apply 1 application topically 3 (three) times daily as needed (for pain).    Historical Provider, MD  levofloxacin (LEVAQUIN) 750 MG tablet Take 1 tablet (750 mg total) by mouth daily. 05/20/16   Emily Filbert, MD  losartan (COZAAR) 50 MG tablet Take 50 mg by mouth 2 (two) times daily.    Historical Provider, MD  mirtazapine (REMERON) 7.5 MG tablet Take 7.5 mg by mouth at bedtime.    Historical Provider, MD  montelukast (SINGULAIR) 10 MG tablet Take 10 mg by mouth daily.     Historical Provider, MD  Multiple Vitamin (MULTIVITAMIN WITH MINERALS) TABS tablet Take 1 tablet by mouth daily.    Historical Provider, MD  Multiple Vitamins-Minerals (PRESERVISION AREDS) CAPS Take 1 capsule by mouth daily.    Historical Provider, MD  ondansetron (ZOFRAN) 4 MG tablet Take 1 tablet (4 mg total) by mouth every 8 (eight) hours as needed for nausea or vomiting. 02/13/16   Phineas Semen, MD  pentosan polysulfate (ELMIRON) 100 MG capsule Take 100 mg by mouth daily.     Historical Provider, MD  senna-docusate (SENOKOT-S) 8.6-50 MG tablet Take 1 tablet by mouth daily.    Historical Provider, MD     Allergies Aricept [donepezil hcl]; Aspirin; Etodolac; Namenda [memantine hcl]; Shellfish allergy; Talwin [pentazocine]; and Penicillins   History reviewed. No pertinent family history.  Social History Social History  Substance Use Topics  . Smoking status: Never Smoker  . Smokeless tobacco: Not on file  . Alcohol use No    Review of Systems Unable  to reliably obtained due to altered mental status ____________________________________________   PHYSICAL EXAM:  VITAL SIGNS: ED Triage Vitals  Enc Vitals Group     BP 01/26/17 1638 (!) 141/83     Pulse Rate 01/26/17 1638 63     Resp 01/26/17 1638 18     Temp 01/26/17 1638 98.5 F (36.9 C)     Temp Source 01/26/17 1638 Oral     SpO2 01/26/17 1638 98 %     Weight 01/26/17 1639 130 lb (59 kg)     Height 01/26/17 1639  (1.651 m)     Head Circumference --      Peak Flow --      Pain Score --      Pain Loc --      Pain Edu? --      Excl. in GC? --     Vital signs reviewed, nursing assessments reviewed.   Constitutional:   Alert and orientedTo self. Well appearing and in no distress. Eyes:   No scleral icterus. No conjunctival pallor. PERRL. EOMI.  No nystagmus. ENT   Head:   Normocephalic with old ecchymosis over the right forehead. Acute ecchymosis with hematoma formation over the left forehead. No laceration. There is small amount of ecchymosis and contusion and hematoma over the occiput. No hemotympanum. No bony point tenderness.   Nose:   No congestion/rhinnorhea. No septal hematoma   Mouth/Throat:   MMM, no pharyngeal erythema. No peritonsillar mass.    Neck:   No stridor. No SubQ emphysema. No meningismus. No midline spinal tenderness. Full range of motion. Hematological/Lymphatic/Immunilogical:   No cervical lymphadenopathy. Cardiovascular:   RRR. Symmetric bilateral radial and DP pulses.  No murmurs.  Respiratory:   Normal respiratory effort without tachypnea nor retractions. Breath sounds are clear and equal bilaterally. No wheezes/rales/rhonchi. Gastrointestinal:   Soft and nontender. Non distended. There is no CVA tenderness.  No rebound, rigidity, or guarding. Genitourinary:   deferred Musculoskeletal:   Normal range of motion in all extremities. No joint effusions.  No lower extremity tenderness.  No edema. Neurologic:   Wondering language. Normal  speech..  CN 2-10 normal. Motor grossly intact. No gross focal neurologic deficits are appreciated.  Skin:    Skin is warm, dry and intact.   ____________________________________________    LABS (pertinent positives/negatives) (all labs ordered are listed, but only abnormal results are displayed) Labs Reviewed - No data to display ____________________________________________   EKG    ____________________________________________    RADIOLOGY  Ct Head Wo Contrast  Result Date: 01/26/2017 CLINICAL DATA:  Patient found on floor by staff. Bruise over right frontal region and possible trauma the left frontal and occipital region. History of dementia. EXAM: CT HEAD WITHOUT CONTRAST CT CERVICAL SPINE WITHOUT CONTRAST TECHNIQUE: Multidetector CT imaging of the head and cervical spine was performed following the standard protocol without intravenous contrast. Multiplanar CT image reconstructions of the cervical spine  were also generated. COMPARISON:  March 14, 2013 FINDINGS: CT HEAD FINDINGS Brain: No subdural, epidural, or subarachnoid hemorrhage. The ventricles and sulci are prominent but stable. Cerebellum, brainstem, and basal cisterns are normal. Scattered white matter changes are identified. No acute cortical ischemia or infarct. No midline shift. Vascular: Calcified atherosclerosis is seen in the intracranial carotid arteries. Skull: Normal. Negative for fracture or focal lesion. Sinuses/Orbits: No acute finding. Other: Soft tissue swelling over the left frontal region is identified. Extracranial soft tissues are otherwise normal. CT CERVICAL SPINE FINDINGS Alignment: The cervical spine is fused from C5 through C7. There is straightening of normal lordosis. Minimal anterolisthesis of C4 versus C5 and C7 versus T1 is identified. No fractures are seen in this region and the listhesis is likely degenerative in nature. No other malalignment. Skull base and vertebrae: No fractures. Soft tissues and  spinal canal: Bilateral carotid calcifications are identified. Disc levels: Multilevel degenerative disc disease and facet degenerative changes. Upper chest: A suture line or calcifications are seen along the left anterior lung of no significance. The lung apices are unremarkable. Other: No other abnormalities. IMPRESSION: 1. No acute intracranial abnormality. 2. No fracture or traumatic malalignment in the cervical spine. Electronically Signed   By: Gerome Sam III M.D   On: 01/26/2017 18:10   Ct Cervical Spine Wo Contrast  Result Date: 01/26/2017 CLINICAL DATA:  Patient found on floor by staff. Bruise over right frontal region and possible trauma the left frontal and occipital region. History of dementia. EXAM: CT HEAD WITHOUT CONTRAST CT CERVICAL SPINE WITHOUT CONTRAST TECHNIQUE: Multidetector CT imaging of the head and cervical spine was performed following the standard protocol without intravenous contrast. Multiplanar CT image reconstructions of the cervical spine were also generated. COMPARISON:  March 14, 2013 FINDINGS: CT HEAD FINDINGS Brain: No subdural, epidural, or subarachnoid hemorrhage. The ventricles and sulci are prominent but stable. Cerebellum, brainstem, and basal cisterns are normal. Scattered white matter changes are identified. No acute cortical ischemia or infarct. No midline shift. Vascular: Calcified atherosclerosis is seen in the intracranial carotid arteries. Skull: Normal. Negative for fracture or focal lesion. Sinuses/Orbits: No acute finding. Other: Soft tissue swelling over the left frontal region is identified. Extracranial soft tissues are otherwise normal. CT CERVICAL SPINE FINDINGS Alignment: The cervical spine is fused from C5 through C7. There is straightening of normal lordosis. Minimal anterolisthesis of C4 versus C5 and C7 versus T1 is identified. No fractures are seen in this region and the listhesis is likely degenerative in nature. No other malalignment. Skull base  and vertebrae: No fractures. Soft tissues and spinal canal: Bilateral carotid calcifications are identified. Disc levels: Multilevel degenerative disc disease and facet degenerative changes. Upper chest: A suture line or calcifications are seen along the left anterior lung of no significance. The lung apices are unremarkable. Other: No other abnormalities. IMPRESSION: 1. No acute intracranial abnormality. 2. No fracture or traumatic malalignment in the cervical spine. Electronically Signed   By: Gerome Sam III M.D   On: 01/26/2017 18:10    ____________________________________________   PROCEDURES Procedures  ____________________________________________   INITIAL IMPRESSION / ASSESSMENT AND PLAN / ED COURSE  Pertinent labs & imaging results that were available during my care of the patient were reviewed by me and considered in my medical decision making (see chart for details).  Patient presents with a witnessed fall from [facility]. Has evidence of blunt trauma to the left forehead and occiput. No evidence of skull fracture or C-spine injury.  No other musculoskeletal or spinal injury. The CT scan of the head and cervical spine. Patient will be suitable for discharge home and less any severe findings are revealed. Low suspicion for any underlying event such as ACS PE dissection or stroke.    ----------------------------------------- 6:30 PM on 01/26/2017 -----------------------------------------  CT negative. Remains calm and comfortable. We'll discharge home, follow up with primary care.       ____________________________________________   FINAL CLINICAL IMPRESSION(S) / ED DIAGNOSES  Final diagnoses:  Contusion of face, initial encounter  Contusion of scalp, initial encounter  Fall, initial encounter      New Prescriptions   No medications on file     Portions of this note were generated with dragon dictation software. Dictation errors may occur  despite best attempts at proofreading.    Sharman Cheek, MD 01/26/17 [phone]

## 2017-01-26 NOTE — Discharge Instructions (Signed)
Your CT scan of the head and cervical spine are unremarkable today.

## 2017-01-26 NOTE — ED Notes (Signed)
Second call to 2 different numbers and no answer, one without message  Report given to [place], tech

## 2017-03-21 ENCOUNTER — Emergency Department
Admission: EM | Admit: 2017-03-21 | Discharge: 2017-03-21 | Disposition: A | Discharge status: Home / Self Care | Payer: Medicare Other | Attending: Emergency Medicine | Admitting: Emergency Medicine

## 2017-03-21 ENCOUNTER — Encounter: Payer: Self-pay | Admitting: Vascular Surgery

## 2017-03-21 ENCOUNTER — Emergency Department: Payer: Medicare Other

## 2017-03-21 DIAGNOSIS — Y999 Unspecified external cause status: Secondary | ICD-10-CM | POA: Insufficient documentation

## 2017-03-21 DIAGNOSIS — W06XXXA Fall from bed, initial encounter: Secondary | ICD-10-CM | POA: Diagnosis not present

## 2017-03-21 DIAGNOSIS — Y9389 Activity, other specified: Secondary | ICD-10-CM | POA: Diagnosis not present

## 2017-03-21 DIAGNOSIS — S0003XA Contusion of scalp, initial encounter: Secondary | ICD-10-CM | POA: Diagnosis not present

## 2017-03-21 DIAGNOSIS — I10 Essential (primary) hypertension: Secondary | ICD-10-CM | POA: Diagnosis not present

## 2017-03-21 DIAGNOSIS — J45909 Unspecified asthma, uncomplicated: Secondary | ICD-10-CM | POA: Insufficient documentation

## 2017-03-21 DIAGNOSIS — Y92122 Bedroom in nursing home as the place of occurrence of the external cause: Secondary | ICD-10-CM | POA: Diagnosis not present

## 2017-03-21 DIAGNOSIS — S0990XA Unspecified injury of head, initial encounter: Secondary | ICD-10-CM | POA: Diagnosis present

## 2017-03-21 DIAGNOSIS — J449 Chronic obstructive pulmonary disease, unspecified: Secondary | ICD-10-CM | POA: Diagnosis not present

## 2017-03-21 DIAGNOSIS — Z79899 Other long term (current) drug therapy: Secondary | ICD-10-CM | POA: Insufficient documentation

## 2017-03-21 DIAGNOSIS — W19XXXA Unspecified fall, initial encounter: Secondary | ICD-10-CM

## 2017-03-21 MED ORDER — ACETAMINOPHEN 325 MG PO TABS
650.0000 mg | ORAL_TABLET | Freq: Once | ORAL | Status: AC
  Administered 2017-03-21: 650 mg via ORAL
  Filled 2017-03-21: qty 2

## 2017-03-21 NOTE — ED Provider Notes (Signed)
[facility] Emergency Department Provider Note  ____________________________________________   First MD Initiated Contact with Patient 03/21/17 1555     (approximate)  I have reviewed the triage vital signs and the nursing notes.   HISTORY  Chief Complaint Fall   HPI Sandra Zimmerman is a 83 y.o. female with a history of macular degeneration as well as dementia who is presenting to the emergency department after an unwitnessed fall at her memory care unit. The patient was found next for bed on the mat next to her bed. She was found to have new hematoma to the superior aspect of the midline of her forehead. She has an old hematoma noted to the left side of her forehead that happened several days ago. She is accompanied by her daughter says the patient has frequent falls and poor balance. She does not suspect the patient fell secondary to passing out. No obvious loss of consciousness. Patient denying any headache or neck pain at this time. Last tetanus shot within the past 2 years.Patient is not on any blood thinners.   Past Medical History:  Diagnosis Date  . Anemia   . Asthma   . COPD (chronic obstructive pulmonary disease) (HCC)   . Dementia   . DJD (degenerative joint disease)   . Hyperglycemia   . Hypertension   . Macular degeneration     There are no active problems to display for this patient.   Past Surgical History:  Procedure Laterality Date  . ABDOMINAL HYSTERECTOMY    . BACK SURGERY    . CARPAL TUNNEL RELEASE    . LUNG SURGERY      Prior to Admission medications   Medication Sig Start Date End Date Taking? Authorizing Provider  acetaminophen (TYLENOL) 500 MG tablet Take 1,000 mg by mouth 2 (two) times daily.     [provider]  albuterol (ACCUNEB) 1.25 MG/3ML nebulizer solution Take 1 ampule by nebulization at bedtime. Pt also uses twice daily as needed for wheezing.    [provider]  albuterol (PROVENTIL  HFA;VENTOLIN HFA) 108 (90 Base) MCG/ACT inhaler Inhale 2 puffs into the lungs every 4 (four) hours as needed for wheezing or shortness of breath (and/or cough).    [provider]  amLODipine (NORVASC) 10 MG tablet Take 10 mg by mouth at bedtime.     [provider]  budesonide-formoterol (SYMBICORT) 160-4.5 MCG/ACT inhaler Inhale 1 puff into the lungs 2 (two) times daily.    [provider]  busPIRone (BUSPAR) 15 MG tablet Take 7.5-15 mg by mouth 2 (two) times daily. Pt takes one-half tablet every morning and one tablet every evening.    [provider]  capsaicin (ZOSTRIX) 0.025 % cream Apply 1 application topically 3 (three) times daily as needed (for pain).    [provider]  levofloxacin (LEVAQUIN) 750 MG tablet Take 1 tablet (750 mg total) by mouth daily. 05/20/16   Emily Filbert, MD  losartan (COZAAR) 50 MG tablet Take 50 mg by mouth 2 (two) times daily.    [provider]  mirtazapine (REMERON) 7.5 MG tablet Take 7.5 mg by mouth at bedtime.    [provider]  montelukast (SINGULAIR) 10 MG tablet Take 10 mg by mouth daily.     [provider]  Multiple Vitamin (MULTIVITAMIN WITH MINERALS) TABS tablet Take 1 tablet by mouth daily.    [provider]  Multiple Vitamins-Minerals (PRESERVISION AREDS) CAPS Take 1 capsule by mouth daily.  [provider]  ondansetron (ZOFRAN) 4 MG tablet Take 1 tablet (4 mg total) by mouth every 8 (eight) hours as needed for nausea or vomiting. 02/13/16   Phineas Semen, MD  pentosan polysulfate (ELMIRON) 100 MG capsule Take 100 mg by mouth daily.     [provider]  senna-docusate (SENOKOT-S) 8.6-50 MG tablet Take 1 tablet by mouth daily.    [provider]    Allergies Aricept [donepezil hcl]; Aspirin; Etodolac; Namenda [memantine hcl]; Shellfish allergy; Talwin [pentazocine]; and Penicillins  No family history on file.  Social  History Social History  Substance Use Topics  . Smoking status: Never Smoker  . Smokeless tobacco: Never Used  . 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 ENT: No sore throat. Cardiovascular: Denies chest pain. Respiratory: Denies shortness of breath. Gastrointestinal: No abdominal pain.  No nausea, no vomiting.  No diarrhea.  No constipation. Genitourinary: Negative for dysuria. Musculoskeletal: Negative for back pain. Skin: Negative for rash. Neurological: Negative for headaches, focal weakness or numbness.   ____________________________________________   PHYSICAL EXAM:  VITAL SIGNS: ED Triage Vitals [03/21/17 1601]  Enc Vitals Group     BP (!) 169/91     Pulse Rate 71     Resp 16     Temp 98.1 F (36.7 C)     Temp Source Oral     SpO2 95 %     Weight      Height      Head Circumference      Peak Flow      Pain Score      Pain Loc      Pain Edu?      Excl. in GC?     Constitutional: Alert and oriented. Well appearing and in no acute distress. Eyes: Conjunctivae are normal.  Head: One centimeter hematoma to the superior aspect of the forehead which appears as a blood filled bulla. Mild tenderness to palpation over this area. No depression or bogginess. Yellow, old-appearing hematoma to the left side of forehead. No swelling or deformity. Nose: No congestion/rhinnorhea. Mouth/Throat: Mucous membranes are moist.  Neck: No stridor.  No tenderness to the midline cervical spine. No deformity or step-off. Patient ranges her neck freely. Cardiovascular: Normal rate, regular rhythm. Grossly normal heart sounds.   Respiratory: Normal respiratory effort.  No retractions. Lungs CTAB. Gastrointestinal: Soft and nontender. No distention. . Musculoskeletal: No lower extremity tenderness nor edema.  No joint effusions. Ranges the hips, bilaterally, without any pain or restriction of  motion. No tenderness palpation to the hips or the pelvis.  Neurologic:  Normal speech and language. No gross focal neurologic deficits are appreciated. Skin:  Skin is warm, dry and intact. No rash noted. Psychiatric: Mood and affect are normal. Speech and behavior are normal.  ____________________________________________   LABS (all labs ordered are listed, but only abnormal results are displayed)  Labs Reviewed - No data to display ____________________________________________  EKG   ____________________________________________  RADIOLOGY  CT of the head with a small frontal scalp hematoma. ____________________________________________   PROCEDURES  Procedure(s) performed:  INCISION AND DRAINAGE Performed by: Arelia Longest Consent: Verbal consent obtained. Risks and benefits: risks, benefits and alternatives were discussed Type: hemorrhagic bulla  Cleaned with chlorhexidine and then used an 18-gauge needle to make 2 small holes which drained maroon blood.  Drainage: bloody  Drainage amount: 2cc   Patient tolerance: Patient tolerated the  procedure well with no immediate complications.  Covered with Band-Aid and without any further drainage  Procedures  Critical Care performed:   ____________________________________________   INITIAL IMPRESSION / ASSESSMENT AND PLAN / ED COURSE  Pertinent labs & imaging results that were available during my care of the patient were reviewed by me and considered in my medical decision making (see chart for details).     ----------------------------------------- 4:41 PM on 03/21/2017 -----------------------------------------  Patient likely with mechanical fall without any injury requiring further testing or hospital stay. Will be discharged home. He spent a plan the patient as well as the daughter was at bedside. They're understanding and willing to comply.  ____________________________________________   FINAL  CLINICAL IMPRESSION(S) / ED DIAGNOSES  Fall. Hematoma.    NEW MEDICATIONS STARTED DURING THIS VISIT:  New Prescriptions   No medications on file     Note:  This document was prepared using Dragon voice recognition software and may include unintentional dictation errors.     Myrna BlazerSchaevitz, Betsaida [place] Matthew, MD 03/21/17 [phone]

## 2017-03-21 NOTE — ED Triage Notes (Signed)
Pt reports to the ED for eval of head pain following a fall. She [facility]. She is legally blind and fell out of her bed and tripped over the mat. No LOC. She has a hematoma to her right forehead extending into the hairline. She is not on blood thinners. She is at her baseline per EMS (per SNF staff). Alert and oriented to person only. She disoriented to situation, place, and time.

## 2017-04-02 DIAGNOSIS — G301 Alzheimer's disease with late onset: Secondary | ICD-10-CM | POA: Diagnosis not present

## 2017-04-02 DIAGNOSIS — I10 Essential (primary) hypertension: Secondary | ICD-10-CM | POA: Diagnosis not present

## 2017-04-02 DIAGNOSIS — F39 Unspecified mood [affective] disorder: Secondary | ICD-10-CM | POA: Diagnosis not present

## 2017-04-02 DIAGNOSIS — J439 Emphysema, unspecified: Secondary | ICD-10-CM | POA: Diagnosis not present

## 2017-04-02 DIAGNOSIS — N3010 Interstitial cystitis (chronic) without hematuria: Secondary | ICD-10-CM | POA: Diagnosis not present

## 2017-04-02 DIAGNOSIS — H540X33 Blindness right eye category 3, blindness left eye category 3: Secondary | ICD-10-CM | POA: Diagnosis not present

## 2017-04-09 DIAGNOSIS — M1711 Unilateral primary osteoarthritis, right knee: Secondary | ICD-10-CM | POA: Diagnosis not present

## 2017-04-11 DIAGNOSIS — R509 Fever, unspecified: Secondary | ICD-10-CM | POA: Diagnosis not present

## 2017-05-23 DIAGNOSIS — J439 Emphysema, unspecified: Secondary | ICD-10-CM | POA: Diagnosis not present

## 2017-05-23 DIAGNOSIS — F39 Unspecified mood [affective] disorder: Secondary | ICD-10-CM | POA: Diagnosis not present

## 2017-05-23 DIAGNOSIS — M159 Polyosteoarthritis, unspecified: Secondary | ICD-10-CM | POA: Diagnosis not present

## 2017-05-23 DIAGNOSIS — I10 Essential (primary) hypertension: Secondary | ICD-10-CM | POA: Diagnosis not present

## 2017-05-23 DIAGNOSIS — H5410 Blindness, one eye, low vision other eye, unspecified eyes: Secondary | ICD-10-CM | POA: Diagnosis not present

## 2017-05-23 DIAGNOSIS — N3010 Interstitial cystitis (chronic) without hematuria: Secondary | ICD-10-CM | POA: Diagnosis not present

## 2017-05-23 DIAGNOSIS — G301 Alzheimer's disease with late onset: Secondary | ICD-10-CM | POA: Diagnosis not present

## 2017-06-12 DIAGNOSIS — M1990 Unspecified osteoarthritis, unspecified site: Secondary | ICD-10-CM | POA: Diagnosis not present

## 2017-06-12 DIAGNOSIS — N3010 Interstitial cystitis (chronic) without hematuria: Secondary | ICD-10-CM | POA: Diagnosis not present

## 2017-06-12 DIAGNOSIS — F339 Major depressive disorder, recurrent, unspecified: Secondary | ICD-10-CM | POA: Diagnosis not present

## 2017-06-12 DIAGNOSIS — H540X33 Blindness right eye category 3, blindness left eye category 3: Secondary | ICD-10-CM | POA: Diagnosis not present

## 2017-06-12 DIAGNOSIS — G309 Alzheimer's disease, unspecified: Secondary | ICD-10-CM | POA: Diagnosis not present

## 2017-06-12 DIAGNOSIS — I10 Essential (primary) hypertension: Secondary | ICD-10-CM | POA: Diagnosis not present

## 2017-07-19 DIAGNOSIS — G309 Alzheimer's disease, unspecified: Secondary | ICD-10-CM | POA: Diagnosis not present

## 2017-07-19 DIAGNOSIS — J449 Chronic obstructive pulmonary disease, unspecified: Secondary | ICD-10-CM | POA: Diagnosis not present

## 2017-07-19 DIAGNOSIS — M1990 Unspecified osteoarthritis, unspecified site: Secondary | ICD-10-CM | POA: Diagnosis not present

## 2017-07-19 DIAGNOSIS — N3010 Interstitial cystitis (chronic) without hematuria: Secondary | ICD-10-CM | POA: Diagnosis not present

## 2017-07-19 DIAGNOSIS — F39 Unspecified mood [affective] disorder: Secondary | ICD-10-CM | POA: Diagnosis not present

## 2017-07-19 DIAGNOSIS — I10 Essential (primary) hypertension: Secondary | ICD-10-CM | POA: Diagnosis not present

## 2017-07-19 DIAGNOSIS — H547 Unspecified visual loss: Secondary | ICD-10-CM | POA: Diagnosis not present

## 2017-07-23 DIAGNOSIS — L309 Dermatitis, unspecified: Secondary | ICD-10-CM | POA: Diagnosis not present

## 2017-07-30 DIAGNOSIS — R21 Rash and other nonspecific skin eruption: Secondary | ICD-10-CM | POA: Diagnosis not present

## 2017-08-30 DIAGNOSIS — R21 Rash and other nonspecific skin eruption: Secondary | ICD-10-CM

## 2017-09-19 DIAGNOSIS — J439 Emphysema, unspecified: Secondary | ICD-10-CM | POA: Diagnosis not present

## 2017-09-19 DIAGNOSIS — N3010 Interstitial cystitis (chronic) without hematuria: Secondary | ICD-10-CM | POA: Diagnosis not present

## 2017-09-19 DIAGNOSIS — I10 Essential (primary) hypertension: Secondary | ICD-10-CM | POA: Diagnosis not present

## 2017-09-19 DIAGNOSIS — F39 Unspecified mood [affective] disorder: Secondary | ICD-10-CM | POA: Diagnosis not present

## 2017-09-19 DIAGNOSIS — G301 Alzheimer's disease with late onset: Secondary | ICD-10-CM | POA: Diagnosis not present

## 2017-09-27 DIAGNOSIS — L309 Dermatitis, unspecified: Secondary | ICD-10-CM | POA: Diagnosis not present

## 2017-10-06 IMAGING — CT CT HEAD W/O CM
3 series · 15 of 47 positions shown, 18 images · non-contrast
Comparison: 01/26/2017

CLINICAL DATA: Status post fall. Head pain. No loss of
consciousness. Not on blood thinners.

EXAM:
CT HEAD WITHOUT CONTRAST
TECHNIQUE: Contiguous axial images were obtained from the base of the skull
through the vertex without intravenous contrast.

[Series 2: head wo · axial · 0.43mm/px · z∈[+167,+292]mm · 9 of 31 slices shown, 12 images]
[im 3/31  brain]
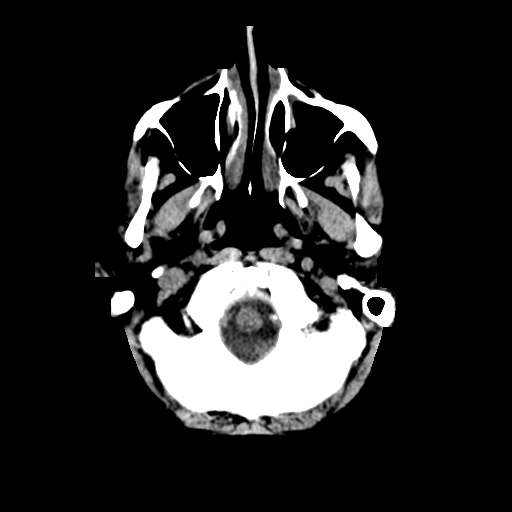
[im 3/31  bone]
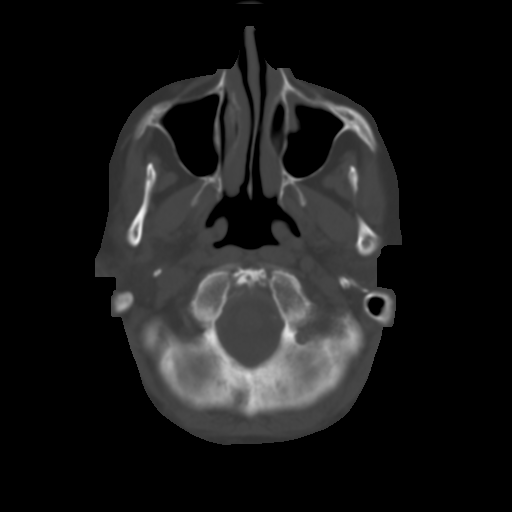
[im 6/31  brain]
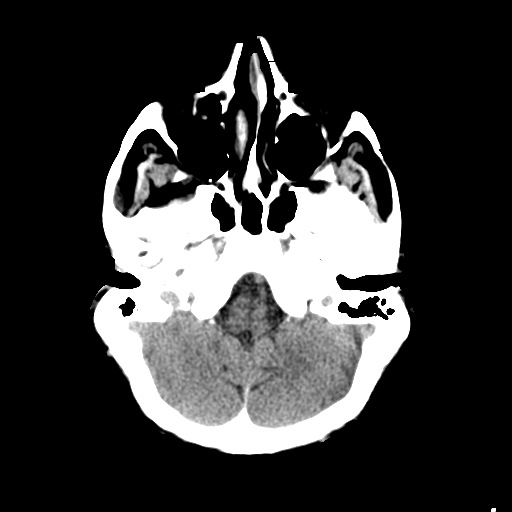
[im 9/31  brain]
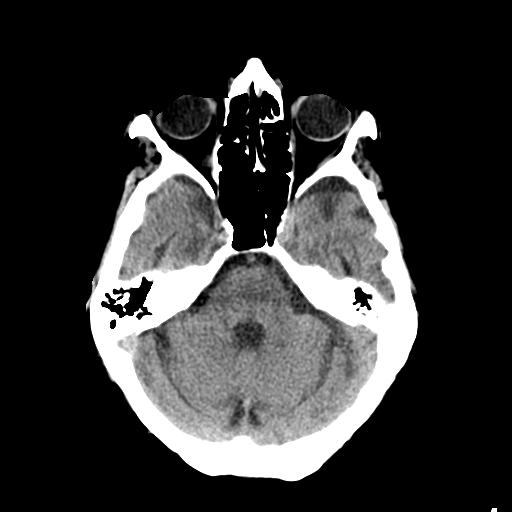
[im 12/31  brain]
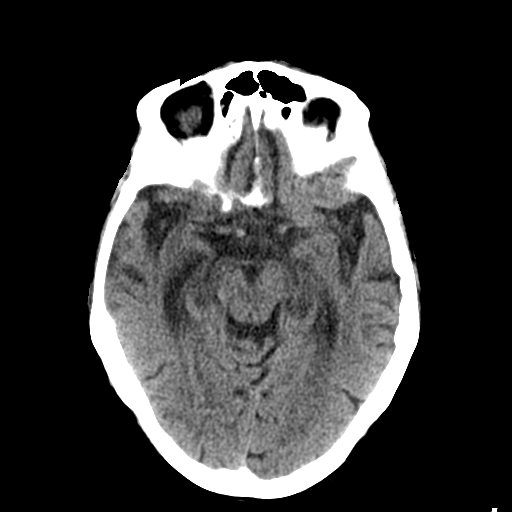
[im 16/31  brain]
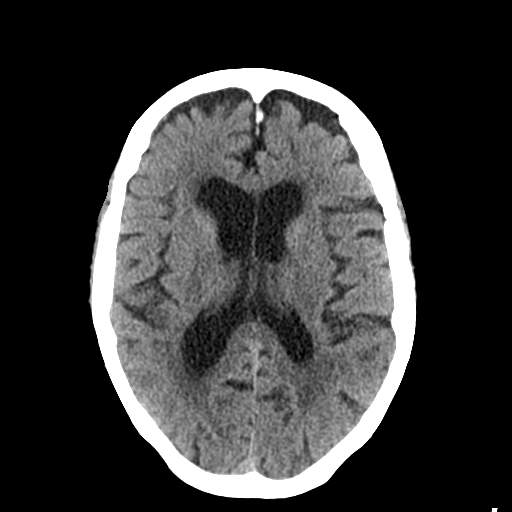
[im 16/31  bone]
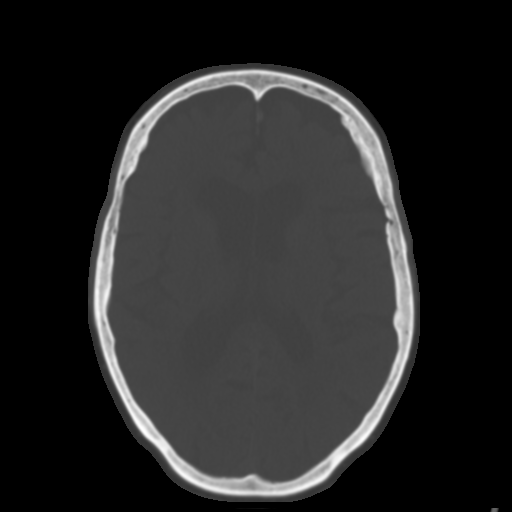
[im 19/31  brain]
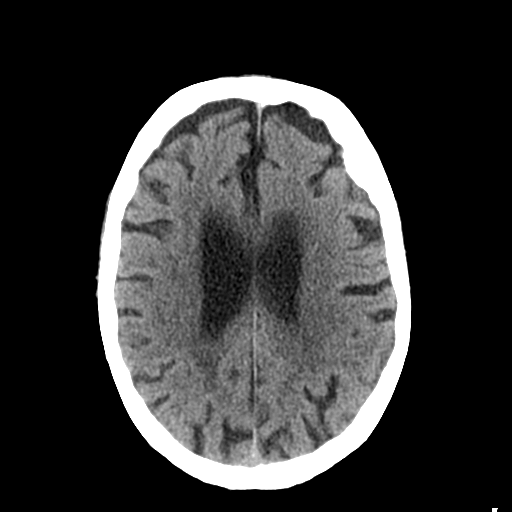
[im 22/31  brain]
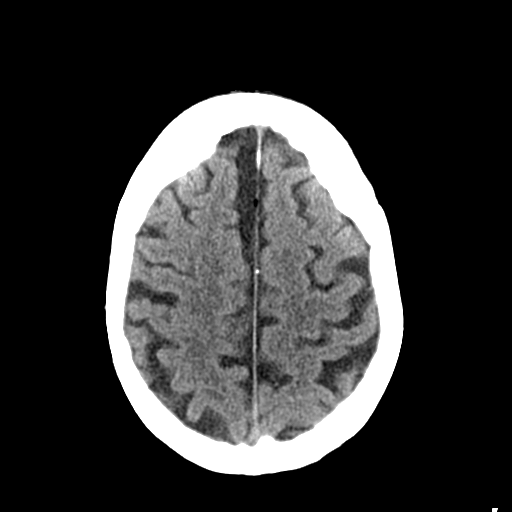
[im 25/31  brain]
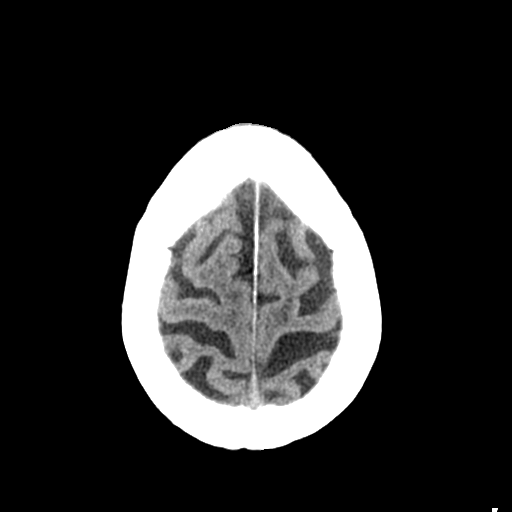
[im 28/31  brain]
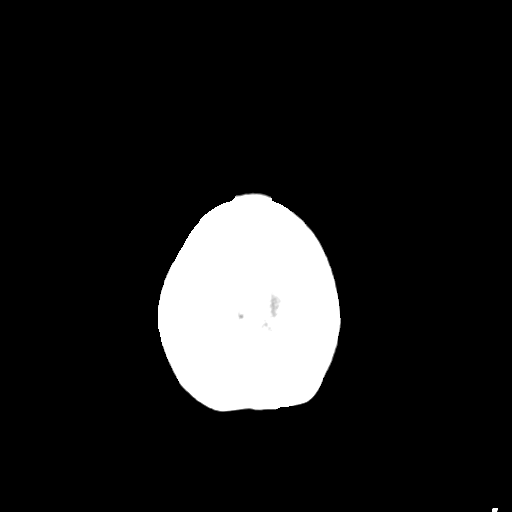
[im 28/31  bone]
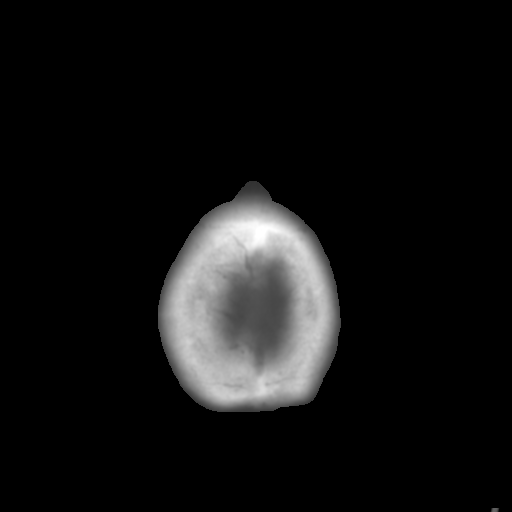

[Series 4: coronal soft tissue · coronal · 0.32mm/px · 3 of 65 slices shown]
[im 22/65  brain]
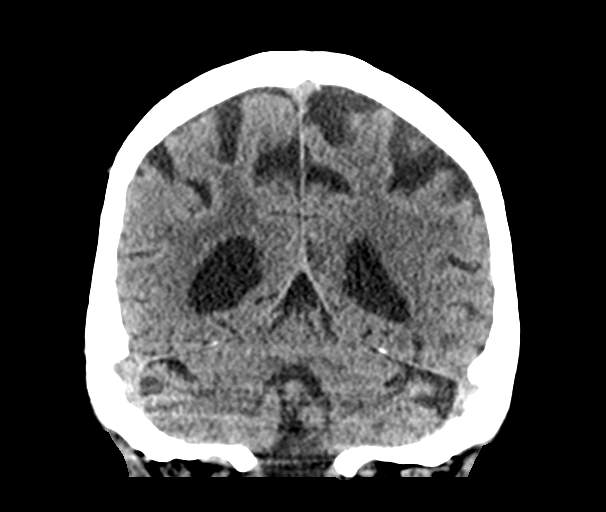
[im 29/65  brain]
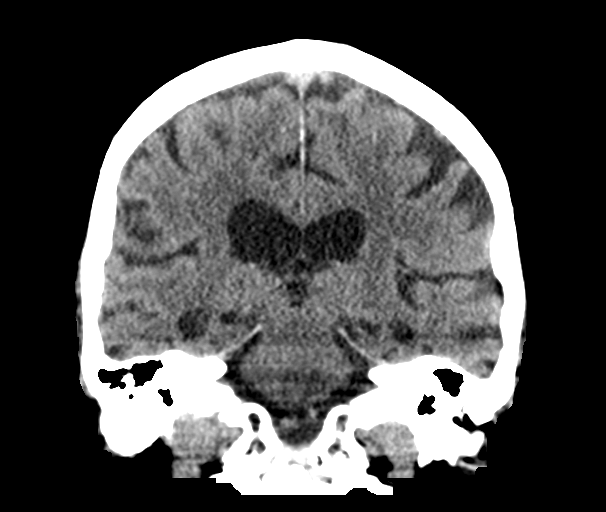
[im 36/65  brain]
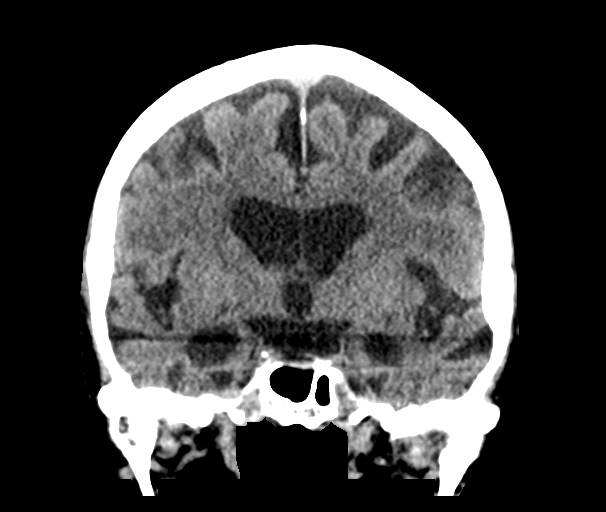

[Series 5: sagittal soft tissue · sagittal · 0.32mm/px · 3 of 47 slices shown]
[im 16/47  brain]
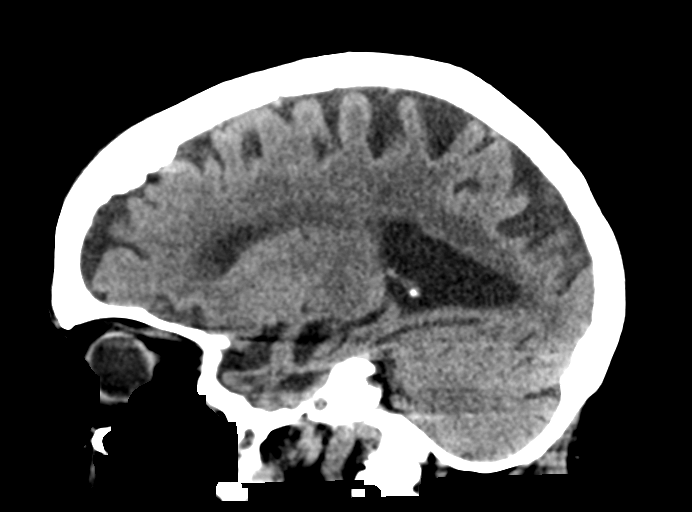
[im 24/47  brain]
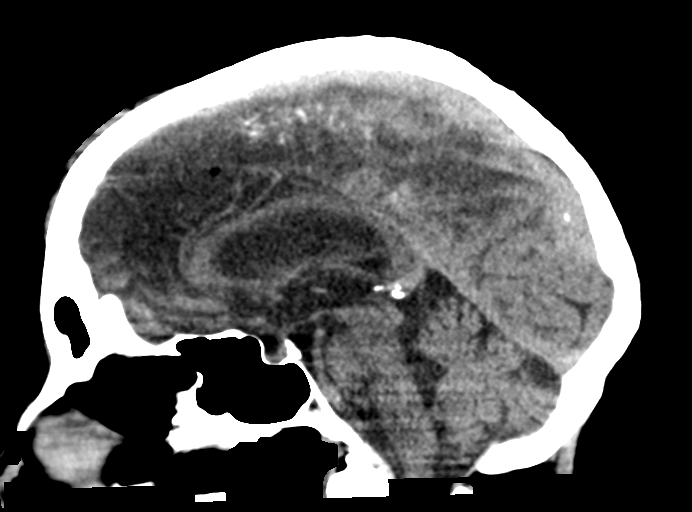
[im 31/47  brain]
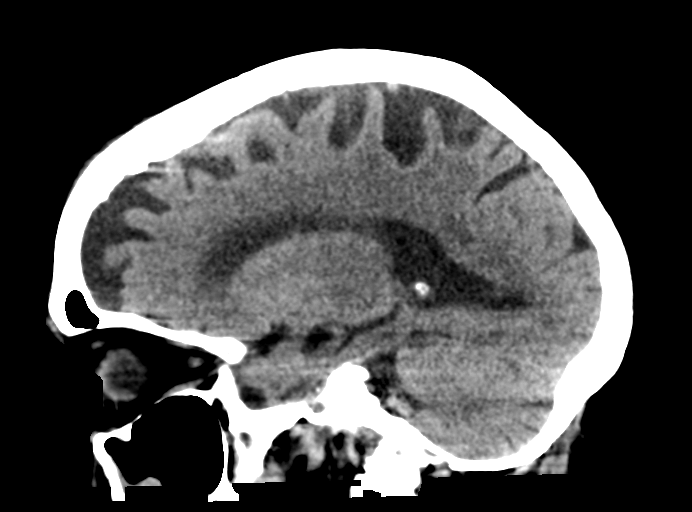

[15 of 47 positions shown; findings below may reference images not displayed]

FINDINGS: Brain: No evidence of acute infarction, hemorrhage, extra-axial
collection, ventriculomegaly, or mass effect. Generalized cerebral
atrophy. Periventricular white matter low attenuation likely
secondary to microangiopathy.

Vascular: Cerebrovascular atherosclerotic calcifications are noted.

Skull: Negative for fracture or focal lesion.

Sinuses/Orbits: Visualized portions of the orbits are unremarkable.
Visualized portions of the paranasal sinuses and mastoid air cells
are unremarkable.

Other: Small frontal scalp hematoma.
IMPRESSION: 1. No acute intracranial pathology.
2. Small frontal scalp hematoma.

## 2017-10-11 DIAGNOSIS — B351 Tinea unguium: Secondary | ICD-10-CM | POA: Diagnosis not present

## 2017-10-22 DIAGNOSIS — L02416 Cutaneous abscess of left lower limb: Secondary | ICD-10-CM

## 2017-11-08 DIAGNOSIS — G309 Alzheimer's disease, unspecified: Secondary | ICD-10-CM | POA: Diagnosis not present

## 2017-11-08 DIAGNOSIS — M1990 Unspecified osteoarthritis, unspecified site: Secondary | ICD-10-CM | POA: Diagnosis not present

## 2017-11-08 DIAGNOSIS — I10 Essential (primary) hypertension: Secondary | ICD-10-CM | POA: Diagnosis not present

## 2017-11-08 DIAGNOSIS — H540X33 Blindness right eye category 3, blindness left eye category 3: Secondary | ICD-10-CM | POA: Diagnosis not present

## 2017-11-08 DIAGNOSIS — J449 Chronic obstructive pulmonary disease, unspecified: Secondary | ICD-10-CM | POA: Diagnosis not present

## 2017-11-08 DIAGNOSIS — N3010 Interstitial cystitis (chronic) without hematuria: Secondary | ICD-10-CM | POA: Diagnosis not present

## 2017-11-08 DIAGNOSIS — F39 Unspecified mood [affective] disorder: Secondary | ICD-10-CM | POA: Diagnosis not present

## 2017-12-20 DIAGNOSIS — R21 Rash and other nonspecific skin eruption: Secondary | ICD-10-CM | POA: Diagnosis not present

## 2017-12-30 DIAGNOSIS — J439 Emphysema, unspecified: Secondary | ICD-10-CM | POA: Diagnosis not present

## 2017-12-30 DIAGNOSIS — N3010 Interstitial cystitis (chronic) without hematuria: Secondary | ICD-10-CM | POA: Diagnosis not present

## 2017-12-30 DIAGNOSIS — F39 Unspecified mood [affective] disorder: Secondary | ICD-10-CM | POA: Diagnosis not present

## 2017-12-30 DIAGNOSIS — I10 Essential (primary) hypertension: Secondary | ICD-10-CM | POA: Diagnosis not present

## 2017-12-30 DIAGNOSIS — G301 Alzheimer's disease with late onset: Secondary | ICD-10-CM | POA: Diagnosis not present

## 2018-02-21 DIAGNOSIS — R05 Cough: Secondary | ICD-10-CM | POA: Diagnosis not present

## 2018-03-04 DIAGNOSIS — J441 Chronic obstructive pulmonary disease with (acute) exacerbation: Secondary | ICD-10-CM | POA: Diagnosis not present

## 2018-03-11 DIAGNOSIS — R05 Cough: Secondary | ICD-10-CM | POA: Diagnosis not present

## 2018-03-12 DIAGNOSIS — R05 Cough: Secondary | ICD-10-CM | POA: Diagnosis not present

## 2018-03-12 DIAGNOSIS — J42 Unspecified chronic bronchitis: Secondary | ICD-10-CM

## 2018-03-13 ENCOUNTER — Emergency Department: Payer: Medicare Other

## 2018-03-13 ENCOUNTER — Emergency Department
Admission: EM | Admit: 2018-03-13 | Discharge: 2018-03-14 | Disposition: A | Discharge status: Home / Self Care | Payer: Medicare Other | Attending: Emergency Medicine | Admitting: Emergency Medicine

## 2018-03-13 ENCOUNTER — Other Ambulatory Visit: Payer: Self-pay

## 2018-03-13 DIAGNOSIS — J40 Bronchitis, not specified as acute or chronic: Secondary | ICD-10-CM | POA: Insufficient documentation

## 2018-03-13 DIAGNOSIS — I10 Essential (primary) hypertension: Secondary | ICD-10-CM | POA: Insufficient documentation

## 2018-03-13 DIAGNOSIS — Z79899 Other long term (current) drug therapy: Secondary | ICD-10-CM | POA: Diagnosis not present

## 2018-03-13 DIAGNOSIS — F0390 Unspecified dementia without behavioral disturbance: Secondary | ICD-10-CM | POA: Diagnosis not present

## 2018-03-13 DIAGNOSIS — R05 Cough: Secondary | ICD-10-CM | POA: Diagnosis present

## 2018-03-13 LAB — CBC WITH DIFFERENTIAL/PLATELET
Basophils Absolute: 0 10*3/uL (ref 0–0.1)
Basophils Relative: 0 %
EOS PCT: 1 %
Eosinophils Absolute: 0.1 10*3/uL (ref 0–0.7)
HCT: 36.5 % (ref 35.0–47.0)
Hemoglobin: 12 g/dL (ref 12.0–16.0)
LYMPHS PCT: 28 %
Lymphs Abs: 2.1 10*3/uL (ref 1.0–3.6)
MCH: 28 pg (ref 26.0–34.0)
MCHC: 32.9 g/dL (ref 32.0–36.0)
MCV: 84.9 fL (ref 80.0–100.0)
MONO ABS: 0.6 10*3/uL (ref 0.2–0.9)
Monocytes Relative: 8 %
Neutro Abs: 4.8 10*3/uL (ref 1.4–6.5)
Neutrophils Relative %: 63 %
Platelets: 211 10*3/uL (ref 150–440)
RBC: 4.3 MIL/uL (ref 3.80–5.20)
RDW: 14.1 % (ref 11.5–14.5)
WBC: 7.6 10*3/uL (ref 3.6–11.0)

## 2018-03-13 LAB — COMPREHENSIVE METABOLIC PANEL
ALBUMIN: 3.6 g/dL (ref 3.5–5.0)
ALK PHOS: 82 U/L (ref 38–126)
ALT: 12 U/L — AB (ref 14–54)
AST: 20 U/L (ref 15–41)
Anion gap: 10 (ref 5–15)
BILIRUBIN TOTAL: 0.4 mg/dL (ref 0.3–1.2)
BUN: 22 mg/dL — AB (ref 6–20)
CO2: 27 mmol/L (ref 22–32)
Calcium: 8.9 mg/dL (ref 8.9–10.3)
Chloride: 100 mmol/L — ABNORMAL LOW (ref 101–111)
Creatinine, Ser: 0.83 mg/dL (ref 0.44–1.00)
GFR calc Af Amer: >60 mL/min (ref >60)
GFR calc non Af Amer: >60 mL/min (ref >60)
GLUCOSE: 141 mg/dL — AB (ref 65–99)
Potassium: 4.3 mmol/L (ref 3.5–5.1)
Sodium: 137 mmol/L (ref 135–145)
TOTAL PROTEIN: 7.2 g/dL (ref 6.5–8.1)

## 2018-03-13 LAB — TROPONIN I: Troponin I: <0.03 ng/mL (ref <0.03)

## 2018-03-13 LAB — URINALYSIS, ROUTINE W REFLEX MICROSCOPIC
BILIRUBIN URINE: NEGATIVE
GLUCOSE, UA: NEGATIVE mg/dL
HGB URINE DIPSTICK: NEGATIVE
KETONES UR: NEGATIVE mg/dL
Leukocytes, UA: NEGATIVE
Nitrite: NEGATIVE
PROTEIN: NEGATIVE mg/dL
Specific Gravity, Urine: 1.02 (ref 1.005–1.030)
pH: 5 (ref 5.0–8.0)

## 2018-03-13 LAB — LACTIC ACID, PLASMA: Lactic Acid, Venous: 1.5 mmol/L (ref 0.5–1.9)

## 2018-03-13 MED ORDER — SODIUM CHLORIDE 0.9 % IV BOLUS
2000.0000 mL | Freq: Once | INTRAVENOUS | Status: AC
  Administered 2018-03-13: 2000 mL via INTRAVENOUS

## 2018-03-13 MED ORDER — PREDNISONE 20 MG PO TABS: 40.0000 mg | ORAL_TABLET | Freq: Every day | ORAL | 0 refills | Status: DC

## 2018-03-13 MED ORDER — IPRATROPIUM-ALBUTEROL 0.5-2.5 (3) MG/3ML IN SOLN
9.0000 mL | Freq: Once | RESPIRATORY_TRACT | Status: AC
  Administered 2018-03-13: 9 mL via RESPIRATORY_TRACT
  Filled 2018-03-13: qty 3

## 2018-03-13 MED ORDER — LEVOFLOXACIN 750 MG PO TABS: 750.0000 mg | ORAL_TABLET | Freq: Every day | ORAL | 0 refills | Status: AC

## 2018-03-13 MED ORDER — LEVOFLOXACIN 750 MG PO TABS
750.0000 mg | ORAL_TABLET | Freq: Once | ORAL | Status: AC
  Administered 2018-03-13: 750 mg via ORAL
  Filled 2018-03-13: qty 1

## 2018-03-13 MED ORDER — METHYLPREDNISOLONE SODIUM SUCC 125 MG IJ SOLR
125.0000 mg | Freq: Once | INTRAMUSCULAR | Status: AC
  Administered 2018-03-13: 125 mg via INTRAVENOUS
  Filled 2018-03-13: qty 2

## 2018-03-13 NOTE — ED Provider Notes (Signed)
[facility] Emergency Department Provider Note ___________________________________________   First MD Initiated Contact with Patient 03/13/18 2019     (approximate)  I have reviewed the triage vital signs and the nursing notes.   HISTORY  Chief Complaint Shortness of Breath and Cough  HPI Sandra Zimmerman is a 84 y.o. female with a history of dementia who is coming to the emergency department today for worsening cough.  EMS notes the cough sounds "wet."  Also patient with rhonchorous lung sounds in route.  Not hypoxic.  Axillary temp of 99.4.  Patient has not been on any antibiotics and EMS notes that the cough is lasted several weeks.  Brought in for worsening respiratory status.  No reports of patient being off her baseline.  No complaints of pain.  Past Medical History:  Diagnosis Date  . Anemia   . Asthma   . COPD (chronic obstructive pulmonary disease) (HCC)   . Dementia   . DJD (degenerative joint disease)   . Hyperglycemia   . Hypertension   . Macular degeneration     There are no active problems to display for this patient.   Past Surgical History:  Procedure Laterality Date  . ABDOMINAL HYSTERECTOMY    . BACK SURGERY    . CARPAL TUNNEL RELEASE    . LUNG SURGERY      Prior to Admission medications   Medication Sig Start Date End Date Taking? Authorizing Provider  acetaminophen (TYLENOL) 500 MG tablet Take 1,000 mg by mouth 2 (two) times daily.     [provider]  albuterol (ACCUNEB) 1.25 MG/3ML nebulizer solution Take 1 ampule by nebulization at bedtime. Pt also uses twice daily as needed for wheezing.    [provider]  albuterol (PROVENTIL HFA;VENTOLIN HFA) 108 (90 Base) MCG/ACT inhaler Inhale 2 puffs into the lungs every 4 (four) hours as needed for wheezing or shortness of breath (and/or cough).    [provider]  amLODipine (NORVASC) 10 MG tablet Take 10 mg by mouth at bedtime.     [provider]  budesonide-formoterol (SYMBICORT) 160-4.5 MCG/ACT inhaler Inhale 1 puff into the lungs 2 (two) times daily.    [provider]  busPIRone (BUSPAR) 15 MG tablet Take 7.5-15 mg by mouth 2 (two) times daily. Pt takes one-half tablet every morning and one tablet every evening.    [provider]  capsaicin (ZOSTRIX) 0.025 % cream Apply 1 application topically 3 (three) times daily as needed (for pain).    [provider]  levofloxacin (LEVAQUIN) 750 MG tablet Take 1 tablet (750 mg total) by mouth daily. 05/20/16   Emily Filbert, MD  losartan (COZAAR) 50 MG tablet Take 50 mg by mouth 2 (two) times daily.    [provider]  mirtazapine (REMERON) 7.5 MG tablet Take 7.5 mg by mouth at bedtime.    [provider]  montelukast (SINGULAIR) 10 MG tablet Take 10 mg by mouth daily.     [provider]  Multiple Vitamin (MULTIVITAMIN WITH MINERALS) TABS tablet Take 1 tablet by mouth daily.    [provider]  Multiple Vitamins-Minerals (PRESERVISION AREDS) CAPS Take 1 capsule by mouth daily.    [provider]  ondansetron (ZOFRAN) 4 MG tablet Take 1 tablet (4 mg total) by mouth every 8 (eight) hours as needed for nausea or vomiting. 02/13/16   Phineas Semen, MD  pentosan polysulfate (ELMIRON) 100 MG capsule Take 100 mg by mouth daily.  [provider]  senna-docusate (SENOKOT-S) 8.6-50 MG tablet Take 1 tablet by mouth daily.    [provider]    Allergies Aricept [donepezil hcl]; Aspirin; Etodolac; Namenda [memantine hcl]; Shellfish allergy; Talwin [pentazocine]; and Penicillins  History reviewed. No pertinent family history.  Social History Social History   Tobacco Use  . Smoking status: Never Smoker  . Smokeless tobacco: Never Used  Substance Use Topics  . Alcohol use: No  . Drug use: No    Review of Systems  Level 5 caveat secondary to  dementia   ____________________________________________   PHYSICAL EXAM:  VITAL SIGNS: ED Triage Vitals  Enc Vitals Group     BP 03/13/18 2030 (!) 154/93     Pulse Rate 03/13/18 2013 (!) 110     Resp 03/13/18 2013 20     Temp 03/13/18 2013 99.2 F (37.3 C)     Temp Source 03/13/18 2013 Rectal     SpO2 03/13/18 2013 98 %     Weight 03/13/18 2013 130 lb (59 kg)     Height 03/13/18 2013 5\' 2"  (1.575 m)     Head Circumference --      Peak Flow --      Pain Score --      Pain Loc --      Pain Edu? --      Excl. in GC? --     Constitutional: Alert and oriented to self only.  Rambling speech. Eyes: Conjunctivae are normal.  Head: Atraumatic. Nose: No congestion/rhinnorhea. Mouth/Throat: Mucous membranes are moist.  Drooling secretions. Neck: No stridor.   Cardiovascular: Tachycardic, regular rhythm. Grossly normal heart sounds.   Respiratory: Mildly tachypneic but without any respiratory distress.  Rhonchorous breath sounds throughout but possibly radiating down from the throat where there is a wet sounding gurgling. Gastrointestinal: Soft and nontender. No distention.  Musculoskeletal: No lower extremity tenderness nor edema.  No joint effusions. Neurologic:  Normal speech and language. No gross focal neurologic deficits are appreciated. Skin:  Skin is warm, dry and intact. No rash noted. Psychiatric: Mood and affect are normal. Speech and behavior are normal.  ____________________________________________   LABS (all labs ordered are listed, but only abnormal results are displayed)  Labs Reviewed  COMPREHENSIVE METABOLIC PANEL - Abnormal; Notable for the following components:      Result Value   Chloride 100 (*)    Glucose, Bld 141 (*)    BUN 22 (*)    ALT 12 (*)    All other components within normal limits  URINALYSIS, ROUTINE W REFLEX MICROSCOPIC - Abnormal; Notable for the following components:   Color, Urine YELLOW (*)    APPearance CLEAR (*)    All other  components within normal limits  CULTURE, BLOOD (ROUTINE X 2)  CULTURE, BLOOD (ROUTINE X 2)  CBC WITH DIFFERENTIAL/PLATELET  LACTIC ACID, PLASMA  LACTIC ACID, PLASMA   ____________________________________________  EKG  ED ECG REPORT I, Arelia Longestavid M Eluterio Seymour, the attending physician, personally viewed and interpreted this ECG.   Date: 03/13/2018  EKG Time: 2013  Rate: 107  Rhythm: Sinus tachycardia  Axis: Normal  Intervals:Prolonged QT at 516  ST&T Change: No obvious ST segment elevation or depression.  No obvious T wave inversion.  However, confounded by baseline static.  ED ECG REPORT I, Arelia Longestavid M Adley Mazurowski, the attending physician, personally viewed and interpreted this ECG.   Date: 03/13/2018  EKG Time: 2315  Rate: 99  Rhythm: normal sinus rhythm  Axis: Normal  Intervals:none  ST&T Change: No ST segment elevation or depression.  No abnormal T wave inversion.  ____________________________________________  RADIOLOGY  Mild cardiomegaly without any acute process ____________________________________________   PROCEDURES  Procedure(s) performed:   Procedures  Critical Care performed:   ____________________________________________   INITIAL IMPRESSION / ASSESSMENT AND PLAN / ED COURSE  Pertinent labs & imaging results that were available during my care of the patient were reviewed by me and considered in my medical decision making (see chart for details).  Differential includes, but is not limited to, viral syndrome, bronchitis including COPD exacerbation, pneumonia, reactive airway disease including asthma, CHF including exacerbation with or without pulmonary/interstitial edema, pneumothorax, ACS, thoracic trauma, and pulmonary embolism. As part of my medical decision making, I reviewed the following data within the electronic MEDICAL RECORD NUMBER Notes from prior ED visits  ----------------------------------------- 11:27 PM on  03/13/2018 -----------------------------------------  Patient's daughter now at the bedside.  Patient is calm and no longer coughing.  Heart rate of 100.  Daughter says that she gets bronchitis about once a year.  Says that it is usually treated with steroids and Levaquin which she has tolerated well in the past.  I discussed the risks of combining these medications but the daughter says that they have had no problems when combining in the past.  Patient appears improved.  Will discharge with Levaquin as well as prednisone.  Repeat EKG without prolonged QTC.   ____________________________________________   FINAL CLINICAL IMPRESSION(S) / ED DIAGNOSES  Bronchitis   NEW MEDICATIONS STARTED DURING THIS VISIT:  New Prescriptions   No medications on file     Note:  This document was prepared using Dragon voice recognition software and may include unintentional dictation errors.     Myrna Blazer, MD 03/13/18 [phone]

## 2018-03-13 NOTE — ED Triage Notes (Signed)
Pt arrived via EMS from [facility] with complaint of SOB and a productive cough for past 3 weeks. Pt has Hx of Dementia, COPD, and is completely blind. Pt was given 1 Duoneb and 125mg  Solumedrol per EMS. VS per EMS were HR-100-108 O2sat-94%RA Temp-99.4 axillary. Pt has a DNR at the bedside along with her paperwork and allergy list. Pt is alert but not oriented.

## 2018-03-13 NOTE — Progress Notes (Addendum)
CODE SEPSIS - PHARMACY COMMUNICATION  **Broad Spectrum Antibiotics should be administered within 1 hour of Sepsis diagnosis**  Time Code Sepsis Called/Page Received: 06/13 2017   Antibiotics Ordered: n/a  Time of 1st antibiotic administration: n/a  Additional action taken by pharmacy: Called ED RN to inquire if code sepsis is still active. She will enter fluids already given and f/u with EDP  If necessary, Name of Provider/Nurse Contacted: Demetrio LappingKala  Add:  6/13 2330 Levaquin ordered    Sandra Zimmerman,Nysir Fergusson S ,PharmD Clinical Pharmacist  03/13/2018  10:09 PM

## 2018-03-13 NOTE — ED Notes (Signed)
This tech into pt room in attempts to obtain repeat EKG per MD order, pt noted to be coughing at this time and EKG not obtained at this time MD notified. Pt provided water by this tech with MD approval.

## 2018-03-14 DIAGNOSIS — N3010 Interstitial cystitis (chronic) without hematuria: Secondary | ICD-10-CM | POA: Diagnosis not present

## 2018-03-14 DIAGNOSIS — F39 Unspecified mood [affective] disorder: Secondary | ICD-10-CM | POA: Diagnosis not present

## 2018-03-14 DIAGNOSIS — I10 Essential (primary) hypertension: Secondary | ICD-10-CM | POA: Diagnosis not present

## 2018-03-14 DIAGNOSIS — G309 Alzheimer's disease, unspecified: Secondary | ICD-10-CM

## 2018-03-14 DIAGNOSIS — M1990 Unspecified osteoarthritis, unspecified site: Secondary | ICD-10-CM

## 2018-03-14 DIAGNOSIS — J449 Chronic obstructive pulmonary disease, unspecified: Secondary | ICD-10-CM | POA: Diagnosis not present

## 2018-03-14 DIAGNOSIS — J209 Acute bronchitis, unspecified: Secondary | ICD-10-CM

## 2018-03-14 NOTE — ED Notes (Signed)
Discharge instructions reviewed with pt legal gaurdian

## 2018-03-18 LAB — CULTURE, BLOOD (ROUTINE X 2)
CULTURE: NO GROWTH
Culture: NO GROWTH
SPECIAL REQUESTS: ADEQUATE
Special Requests: ADEQUATE

## 2018-05-20 DIAGNOSIS — F39 Unspecified mood [affective] disorder: Secondary | ICD-10-CM | POA: Diagnosis not present

## 2018-05-20 DIAGNOSIS — G301 Alzheimer's disease with late onset: Secondary | ICD-10-CM | POA: Diagnosis not present

## 2018-05-20 DIAGNOSIS — I10 Essential (primary) hypertension: Secondary | ICD-10-CM | POA: Diagnosis not present

## 2018-05-20 DIAGNOSIS — J439 Emphysema, unspecified: Secondary | ICD-10-CM

## 2018-07-16 DIAGNOSIS — J449 Chronic obstructive pulmonary disease, unspecified: Secondary | ICD-10-CM

## 2018-07-16 DIAGNOSIS — M1990 Unspecified osteoarthritis, unspecified site: Secondary | ICD-10-CM

## 2018-07-16 DIAGNOSIS — N3010 Interstitial cystitis (chronic) without hematuria: Secondary | ICD-10-CM

## 2018-07-16 DIAGNOSIS — F39 Unspecified mood [affective] disorder: Secondary | ICD-10-CM

## 2018-07-16 DIAGNOSIS — G309 Alzheimer's disease, unspecified: Secondary | ICD-10-CM | POA: Diagnosis not present

## 2018-07-16 DIAGNOSIS — I10 Essential (primary) hypertension: Secondary | ICD-10-CM | POA: Diagnosis not present

## 2018-07-30 DIAGNOSIS — B351 Tinea unguium: Secondary | ICD-10-CM

## 2018-08-12 DIAGNOSIS — L03119 Cellulitis of unspecified part of limb: Secondary | ICD-10-CM

## 2018-09-04 DIAGNOSIS — J439 Emphysema, unspecified: Secondary | ICD-10-CM | POA: Diagnosis not present

## 2018-09-04 DIAGNOSIS — F39 Unspecified mood [affective] disorder: Secondary | ICD-10-CM

## 2018-09-04 DIAGNOSIS — J181 Lobar pneumonia, unspecified organism: Secondary | ICD-10-CM

## 2018-09-04 DIAGNOSIS — I10 Essential (primary) hypertension: Secondary | ICD-10-CM

## 2018-09-04 DIAGNOSIS — N3010 Interstitial cystitis (chronic) without hematuria: Secondary | ICD-10-CM

## 2018-09-04 DIAGNOSIS — G301 Alzheimer's disease with late onset: Secondary | ICD-10-CM

## 2018-09-15 ENCOUNTER — Emergency Department: Payer: Medicare Other

## 2018-09-15 ENCOUNTER — Other Ambulatory Visit: Payer: Self-pay

## 2018-09-15 ENCOUNTER — Emergency Department
Admission: EM | Admit: 2018-09-15 | Discharge: 2018-09-15 | Disposition: A | Discharge status: Home / Self Care | Payer: Medicare Other | Attending: Emergency Medicine | Admitting: Emergency Medicine

## 2018-09-15 DIAGNOSIS — E860 Dehydration: Secondary | ICD-10-CM | POA: Insufficient documentation

## 2018-09-15 DIAGNOSIS — G309 Alzheimer's disease, unspecified: Secondary | ICD-10-CM | POA: Insufficient documentation

## 2018-09-15 DIAGNOSIS — Z79899 Other long term (current) drug therapy: Secondary | ICD-10-CM | POA: Diagnosis not present

## 2018-09-15 DIAGNOSIS — N390 Urinary tract infection, site not specified: Secondary | ICD-10-CM | POA: Insufficient documentation

## 2018-09-15 DIAGNOSIS — R4182 Altered mental status, unspecified: Secondary | ICD-10-CM | POA: Diagnosis present

## 2018-09-15 DIAGNOSIS — J449 Chronic obstructive pulmonary disease, unspecified: Secondary | ICD-10-CM | POA: Insufficient documentation

## 2018-09-15 DIAGNOSIS — I10 Essential (primary) hypertension: Secondary | ICD-10-CM | POA: Diagnosis not present

## 2018-09-15 LAB — TROPONIN I: Troponin I: <0.03 ng/mL (ref <0.03)

## 2018-09-15 LAB — CBC WITH DIFFERENTIAL/PLATELET
ABS IMMATURE GRANULOCYTES: 0.03 10*3/uL (ref 0.00–0.07)
BASOS ABS: 0 10*3/uL (ref 0.0–0.1)
Basophils Relative: 0 %
Eosinophils Absolute: 0.2 10*3/uL (ref 0.0–0.5)
Eosinophils Relative: 3 %
HEMATOCRIT: 42.1 % (ref 36.0–46.0)
Hemoglobin: 12.5 g/dL (ref 12.0–15.0)
IMMATURE GRANULOCYTES: 0 %
LYMPHS ABS: 1.7 10*3/uL (ref 0.7–4.0)
Lymphocytes Relative: 22 %
MCH: 26.7 pg (ref 26.0–34.0)
MCHC: 29.7 g/dL — ABNORMAL LOW (ref 30.0–36.0)
MCV: 89.8 fL (ref 80.0–100.0)
Monocytes Absolute: 0.7 10*3/uL (ref 0.1–1.0)
Monocytes Relative: 9 %
NEUTROS ABS: 5 10*3/uL (ref 1.7–7.7)
NEUTROS PCT: 66 %
NRBC: 0 % (ref 0.0–0.2)
PLATELETS: 166 10*3/uL (ref 150–400)
RBC: 4.69 MIL/uL (ref 3.87–5.11)
RDW: 14.7 % (ref 11.5–15.5)
WBC: 7.6 10*3/uL (ref 4.0–10.5)

## 2018-09-15 LAB — BASIC METABOLIC PANEL
ANION GAP: 8 (ref 5–15)
BUN: 46 mg/dL — ABNORMAL HIGH (ref 8–23)
CO2: 24 mmol/L (ref 22–32)
Calcium: 8.9 mg/dL (ref 8.9–10.3)
Chloride: 113 mmol/L — ABNORMAL HIGH (ref 98–111)
Creatinine, Ser: 1.14 mg/dL — ABNORMAL HIGH (ref 0.44–1.00)
GFR, EST AFRICAN AMERICAN: 52 mL/min — AB (ref >60)
GFR, EST NON AFRICAN AMERICAN: 45 mL/min — AB (ref >60)
Glucose, Bld: 121 mg/dL — ABNORMAL HIGH (ref 70–99)
POTASSIUM: 4.2 mmol/L (ref 3.5–5.1)
Sodium: 145 mmol/L (ref 135–145)

## 2018-09-15 LAB — URINALYSIS, COMPLETE (UACMP) WITH MICROSCOPIC
Bilirubin Urine: NEGATIVE
GLUCOSE, UA: 50 mg/dL — AB
Hgb urine dipstick: NEGATIVE
Ketones, ur: NEGATIVE mg/dL
Nitrite: NEGATIVE
Protein, ur: 30 mg/dL — AB
SPECIFIC GRAVITY, URINE: 1.01 (ref 1.005–1.030)
Squamous Epithelial / LPF: NONE SEEN (ref 0–5)
WBC, UA: >50 WBC/hpf — ABNORMAL HIGH (ref 0–5)
pH: 5 (ref 5.0–8.0)

## 2018-09-15 MED ORDER — CEPHALEXIN 500 MG PO CAPS: 500.0000 mg | ORAL_CAPSULE | Freq: Three times a day (TID) | ORAL | 0 refills | Status: AC

## 2018-09-15 MED ORDER — SODIUM CHLORIDE 0.9 % IV SOLN
1.0000 g | Freq: Once | INTRAVENOUS | Status: AC
  Administered 2018-09-15: 1 g via INTRAVENOUS
  Filled 2018-09-15: qty 10

## 2018-09-15 MED ORDER — SODIUM CHLORIDE 0.9 % IV BOLUS
1000.0000 mL | Freq: Once | INTRAVENOUS | Status: AC
  Administered 2018-09-15: 1000 mL via INTRAVENOUS

## 2018-09-15 MED ORDER — SODIUM CHLORIDE 0.9 % IV SOLN
INTRAVENOUS | Status: AC
  Filled 2018-09-15: qty 10

## 2018-09-15 NOTE — ED Notes (Signed)

## 2018-09-15 NOTE — ED Provider Notes (Signed)
[facility] Emergency Department Provider Note  ____________________________________________  Time seen: Approximately 2:01 PM  I have reviewed the triage vital signs and the nursing notes.   HISTORY  Chief Complaint Fatigue   HPI Sandra Zimmerman is a 84 y.o. female with a history of advanced Alzheimer's, COPD, hypertension, anemia who presents for evaluation of altered mental status.  According to the daughter patient's mental status has declined over the last 7 days.  Today patient took 3 or 4 deep breaths and had a very mild cough.  She has been diagnosed with pneumonia twice this year and daughter was afraid that she might have pneumonia.  Also has had recurrent UTIs.  She has had no fever, no vomiting, no diarrhea, no difficulty breathing.  Patient is extremely confused and disoriented which according to the daughter is a change from her baseline for the last 7 days.  No prior history of stroke.  Daughter reports that patient seems confused when given food or drinks and she is not sure if she is having difficulty swallowing or if she just does not know what to do with it.  She did have a recent modified barium swallow study without any frank aspiration.   Past Medical History:  Diagnosis Date  . Anemia   . Asthma   . COPD (chronic obstructive pulmonary disease) (HCC)   . Dementia (HCC)   . DJD (degenerative joint disease)   . Hyperglycemia   . Hypertension   . Macular degeneration      Past Surgical History:  Procedure Laterality Date  . ABDOMINAL HYSTERECTOMY    . BACK SURGERY    . CARPAL TUNNEL RELEASE    . LUNG SURGERY      Prior to Admission medications   Medication Sig Start Date End Date Taking? Authorizing Provider  acetaminophen (TYLENOL) 500 MG tablet Take 500 mg by mouth 3 (three) times daily.    Yes [provider]  albuterol (ACCUNEB) 1.25 MG/3ML nebulizer solution Take 1 ampule by nebulization daily.    Yes [provider]  albuterol (ACCUNEB) 1.25 MG/3ML nebulizer solution Take 1 ampule by nebulization every 12 (twelve) hours as needed for wheezing or shortness of breath.   Yes [provider]  albuterol (PROVENTIL HFA;VENTOLIN HFA) 108 (90 Base) MCG/ACT inhaler Inhale 2 puffs into the lungs every 4 (four) hours as needed for shortness of breath (cough).    Yes [provider]  budesonide-formoterol (SYMBICORT) 160-4.5 MCG/ACT inhaler Inhale 1 puff into the lungs 2 (two) times daily.    Yes [provider]  busPIRone (BUSPAR) 5 MG tablet Take 5 mg by mouth 2 (two) times daily.   Yes [provider]  Carboxymethylcellulose Sodium (REFRESH TEARS OP) Place 2 drops into both eyes 2 (two) times daily.   Yes [provider]  chlorhexidine (HIBICLENS) 4 % external liquid Apply 1 application topically every other day. To body and legs   Yes [provider]  Cranberry 450 MG TABS Take 450 mg by mouth daily.   Yes [provider]  fluticasone (FLONASE) 50 MCG/ACT nasal spray Place 1 spray into both nostrils 2 (two) times daily.    Yes [provider]  furosemide (LASIX) 20 MG tablet Take 20 mg by mouth daily as needed for edema.   Yes [provider]  guaiFENesin (ROBITUSSIN) 100 MG/5ML SOLN Take 10 mLs by mouth every 4 (four) hours as needed for cough.   Yes [provider]  hydrOXYzine (ATARAX/VISTARIL) 10 MG tablet Take 10 mg by mouth 3 (three) times daily as needed for itching.   Yes [provider]  losartan (COZAAR) 50 MG tablet Take 50 mg by mouth 2 (two) times daily.    Yes [provider]  Melatonin 5 MG TABS Take 5 mg by mouth at bedtime.    Yes [provider]  methenamine (MANDELAMINE) 1 g tablet Take 1,000 mg by mouth 2 (two) times daily.   Yes [provider]  mirtazapine (REMERON) 15 MG tablet Take 7.5 mg by mouth at bedtime.   Yes [provider]  montelukast  (SINGULAIR) 10 MG tablet Take 10 mg by mouth daily.    Yes [provider]  Multiple Vitamin (THEREMS) TABS Take 1 tablet by mouth daily.   Yes [provider]  Multiple Vitamins-Minerals (I-VITE) TABS Take 1 tablet by mouth daily.   Yes [provider]  mupirocin ointment (BACTROBAN) 2 % Place 1 application into the nose 3 (three) times a week. At bedtime on Monday, Tuesday, and Saturday   Yes [provider]  potassium chloride SA (K-DUR,KLOR-CON) 20 MEQ tablet Take 20 mEq by mouth daily.   Yes [provider]  senna (SENOKOT) 8.6 MG TABS tablet Take 1 tablet by mouth daily as needed for mild constipation.   Yes [provider]  Skin Protectants, Misc. (MINERIN) CREA Apply topically 2 (two) times daily. Apply to dry/itchy skin   Yes [provider]  cephALEXin (KEFLEX) 500 MG capsule Take 1 capsule (500 mg total) by mouth 3 (three) times daily for 7 days. 09/15/18 09/22/18  Nita Sickle, MD    Allergies Aricept Palma Holter hcl]; Aspirin; Etodolac; Namenda [memantine hcl]; Shellfish allergy; Talwin [pentazocine]; and Penicillins  History reviewed. No pertinent family history.  Social History Social History   Tobacco Use  . Smoking status: Never Smoker  . Smokeless tobacco: Never Used  Substance Use Topics  . Alcohol use: No  . Drug use: No    Review of Systems  Constitutional: + confusion. Negative for fever. Respiratory: Negative for shortness of breath. + cough Gastrointestinal: Negative vomiting or diarrhea.  ____________________________________________   PHYSICAL EXAM:  VITAL SIGNS: ED Triage Vitals [09/15/18 1342]  Enc Vitals Group     BP (!) 143/95     Pulse      Resp 20     Temp 98.6 F (37 C)     Temp Source Axillary     SpO2      Weight      Height      Head Circumference      Peak Flow      Pain Score      Pain Loc      Pain Edu?      Excl. in GC?     Constitutional: Awake  completely disoriented, unable to answer any questions  HEENT:      Head: Normocephalic and atraumatic.         Eyes: Conjunctivae are normal. Sclera is non-icteric.       Mouth/Throat: Mucous membranes are moist.       Neck: Supple with no signs of meningismus. Cardiovascular: Regular rate and rhythm. No murmurs, gallops, or rubs. 2+ symmetrical distal pulses are present in all extremities. No JVD. Respiratory: Normal respiratory effort, shallow breaths. Lungs are clear to auscultation bilaterally. No wheezes, crackles, or rhonchi.  Gastrointestinal: Soft, non tender, and non distended with positive bowel sounds. No rebound or guarding. Musculoskeletal:  No edema, cyanosis, or erythema of extremities. Neurologic: Slurred speech, confused, moving all extremities spontaneously, no flaccid paralysis, downgoing toes Skin: Skin is warm, dry and intact. No rash noted. Psychiatric: Mood and affect are normal. Speech and behavior are normal.  ____________________________________________   LABS (all labs ordered are listed, but only abnormal results are displayed)  Labs Reviewed  CBC WITH DIFFERENTIAL/PLATELET - Abnormal; Notable for the following components:      Result Value   MCHC 29.7 (*)    All other components within normal limits  BASIC METABOLIC PANEL - Abnormal; Notable for the following components:   Chloride 113 (*)    Glucose, Bld 121 (*)    BUN 46 (*)    Creatinine, Ser 1.14 (*)    GFR calc non Af Amer 45 (*)    GFR calc Af Amer 52 (*)    All other components within normal limits  URINALYSIS, COMPLETE (UACMP) WITH MICROSCOPIC - Abnormal; Notable for the following components:   Color, Urine YELLOW (*)    APPearance CLEAR (*)    Glucose, UA 50 (*)    Protein, ur 30 (*)    Leukocytes, UA TRACE (*)    WBC, UA >50 (*)    Bacteria, UA RARE (*)    All other components within normal limits  TROPONIN I   ____________________________________________  EKG  ED ECG REPORT I,  Nita Sicklearolina Olinda Nola, the attending physician, personally viewed and interpreted this ECG.  Normal sinus rhythm, rate of 92, normal intervals, normal axis, no ST elevations or depressions. ____________________________________________  RADIOLOGY  I have personally reviewed the images performed during this visit and I agree with the Radiologist's read.   Interpretation by Radiologist:  Dg Chest 2 View  Result Date: 09/15/2018 CLINICAL DATA:  Dyspnea and decrease activity EXAM: CHEST - 2 VIEW COMPARISON:  03/13/2018 FINDINGS: Slightly low lung volumes with obscure a shin of the left lung apex due to patient's chin. Mild attenuation of the pulmonary vasculature may reflect a component emphysematous disease. No alveolar consolidation or CHF. No effusion or pneumothorax. Top-normal heart size with aortic atherosclerosis. 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 IMPRESSION: No active cardiopulmonary disease. Aortic atherosclerosis. Electronically Signed   By: Tollie Ethavid  Kwon M.D.   On: 09/15/2018 14:16     ____________________________________________   PROCEDURES  Procedure(s) performed: None Procedures Critical Care performed:  None ____________________________________________   INITIAL IMPRESSION / ASSESSMENT AND PLAN / ED COURSE  84 y.o. female with a history of advanced Alzheimer's, COPD, hypertension, anemia who presents for evaluation of altered mental status x 7 days.  Patient is extremely confused and disoriented unable to provide any history which according to the daughter is a change from her baseline.  She usually able to recognize family members and say a few sentences.  She has slurred speech which per daughter is her baseline but other than that no obvious neurological deficits, she is moving all 4 extremities, no flaccid paralysis, seems pretty strong in all 4 extremities.  She has downgoing toes and a symmetric face.  Differential diagnosis is broad and includes but  not limited to UTI versus pneumonia versus dehydration versus AKI versus electrolyte abnormalities. Labs, CXR, UA pending    _________________________ 4:14 PM on 09/15/2018 -----------------------------------------  Work-up consistent with a UTI and dehydration.  Patient received a liter of fluids and Rocephin.  No signs of sepsis.  Recommended admission for encephalopathy in the setting of UTI and dehydration but daughter prefers that patient goes back  to the nursing home.  She feels the patient might be more confused if she stays outside of her environment.  I do not think this plan is unreasonable.  She will be discharged home on Keflex.  Close follow-up with primary care doctor and standard return precautions were discussed with the daughter.   As part of my medical decision making, I reviewed the following data within the electronic MEDICAL RECORD NUMBER History obtained from family, Nursing notes reviewed and incorporated, Labs reviewed , EKG interpreted , Old EKG reviewed, Old chart reviewed, Radiograph reviewed , Notes from prior ED visits and [place] Controlled Substance Database    Pertinent labs & imaging results that were available during my care of the patient were reviewed by me and considered in my medical decision making (see chart for details).    ____________________________________________   FINAL CLINICAL IMPRESSION(S) / ED DIAGNOSES  Final diagnoses:  Dehydration  Lower urinary tract infectious disease      NEW MEDICATIONS STARTED DURING THIS VISIT:  ED Discharge Orders         Ordered    cephALEXin (KEFLEX) 500 MG capsule  3 times daily     09/15/18 1610           Note:  This document was prepared using Dragon voice recognition software and may include unintentional dictation errors.    Nita Sickle, MD 09/15/18 [phone]

## 2018-09-15 NOTE — ED Triage Notes (Signed)
Pt to ED via EMS from [facility]. Facility/family states that pt has been less active and short of breath. Pt hx of pneumonia 3 wks ago, antibiotics completed. Xray at [facility] was negative for pneumonia. Pt breathing unlabored and is active at this time.

## 2018-09-17 DIAGNOSIS — N390 Urinary tract infection, site not specified: Secondary | ICD-10-CM

## 2018-10-07 DIAGNOSIS — R05 Cough: Secondary | ICD-10-CM

## 2018-11-19 DIAGNOSIS — I10 Essential (primary) hypertension: Secondary | ICD-10-CM | POA: Diagnosis not present

## 2018-11-19 DIAGNOSIS — M1990 Unspecified osteoarthritis, unspecified site: Secondary | ICD-10-CM | POA: Diagnosis not present

## 2018-11-19 DIAGNOSIS — J449 Chronic obstructive pulmonary disease, unspecified: Secondary | ICD-10-CM

## 2018-11-19 DIAGNOSIS — F39 Unspecified mood [affective] disorder: Secondary | ICD-10-CM | POA: Diagnosis not present

## 2018-11-19 DIAGNOSIS — G309 Alzheimer's disease, unspecified: Secondary | ICD-10-CM | POA: Diagnosis not present

## 2018-11-19 DIAGNOSIS — N3010 Interstitial cystitis (chronic) without hematuria: Secondary | ICD-10-CM

## 2018-12-03 DIAGNOSIS — L662 Folliculitis decalvans: Secondary | ICD-10-CM

## 2018-12-03 DIAGNOSIS — L03116 Cellulitis of left lower limb: Secondary | ICD-10-CM | POA: Diagnosis not present

## 2019-01-05 DIAGNOSIS — N3010 Interstitial cystitis (chronic) without hematuria: Secondary | ICD-10-CM

## 2019-01-05 DIAGNOSIS — J439 Emphysema, unspecified: Secondary | ICD-10-CM | POA: Diagnosis not present

## 2019-01-05 DIAGNOSIS — G301 Alzheimer's disease with late onset: Secondary | ICD-10-CM | POA: Diagnosis not present

## 2019-01-05 DIAGNOSIS — F39 Unspecified mood [affective] disorder: Secondary | ICD-10-CM | POA: Diagnosis not present

## 2019-02-02 DIAGNOSIS — R21 Rash and other nonspecific skin eruption: Secondary | ICD-10-CM

## 2019-03-13 DIAGNOSIS — M1990 Unspecified osteoarthritis, unspecified site: Secondary | ICD-10-CM | POA: Diagnosis not present

## 2019-03-13 DIAGNOSIS — J449 Chronic obstructive pulmonary disease, unspecified: Secondary | ICD-10-CM

## 2019-03-13 DIAGNOSIS — I10 Essential (primary) hypertension: Secondary | ICD-10-CM | POA: Diagnosis not present

## 2019-03-13 DIAGNOSIS — N3010 Interstitial cystitis (chronic) without hematuria: Secondary | ICD-10-CM

## 2019-03-13 DIAGNOSIS — F39 Unspecified mood [affective] disorder: Secondary | ICD-10-CM | POA: Diagnosis not present

## 2019-03-13 DIAGNOSIS — G309 Alzheimer's disease, unspecified: Secondary | ICD-10-CM

## 2019-03-27 DIAGNOSIS — L630 Alopecia (capitis) totalis: Secondary | ICD-10-CM | POA: Diagnosis not present

## 2019-04-02 IMAGING — CR DG CHEST 2V
1 series · 2 of 2 positions shown · non-contrast
Comparison: 03/13/2018

CLINICAL DATA: Dyspnea and decrease activity

EXAM:
CHEST - 2 VIEW

[Series 1: dg chest 2 view · 0.14mm/px · 2 of 2 slices shown]
[im 1/2]
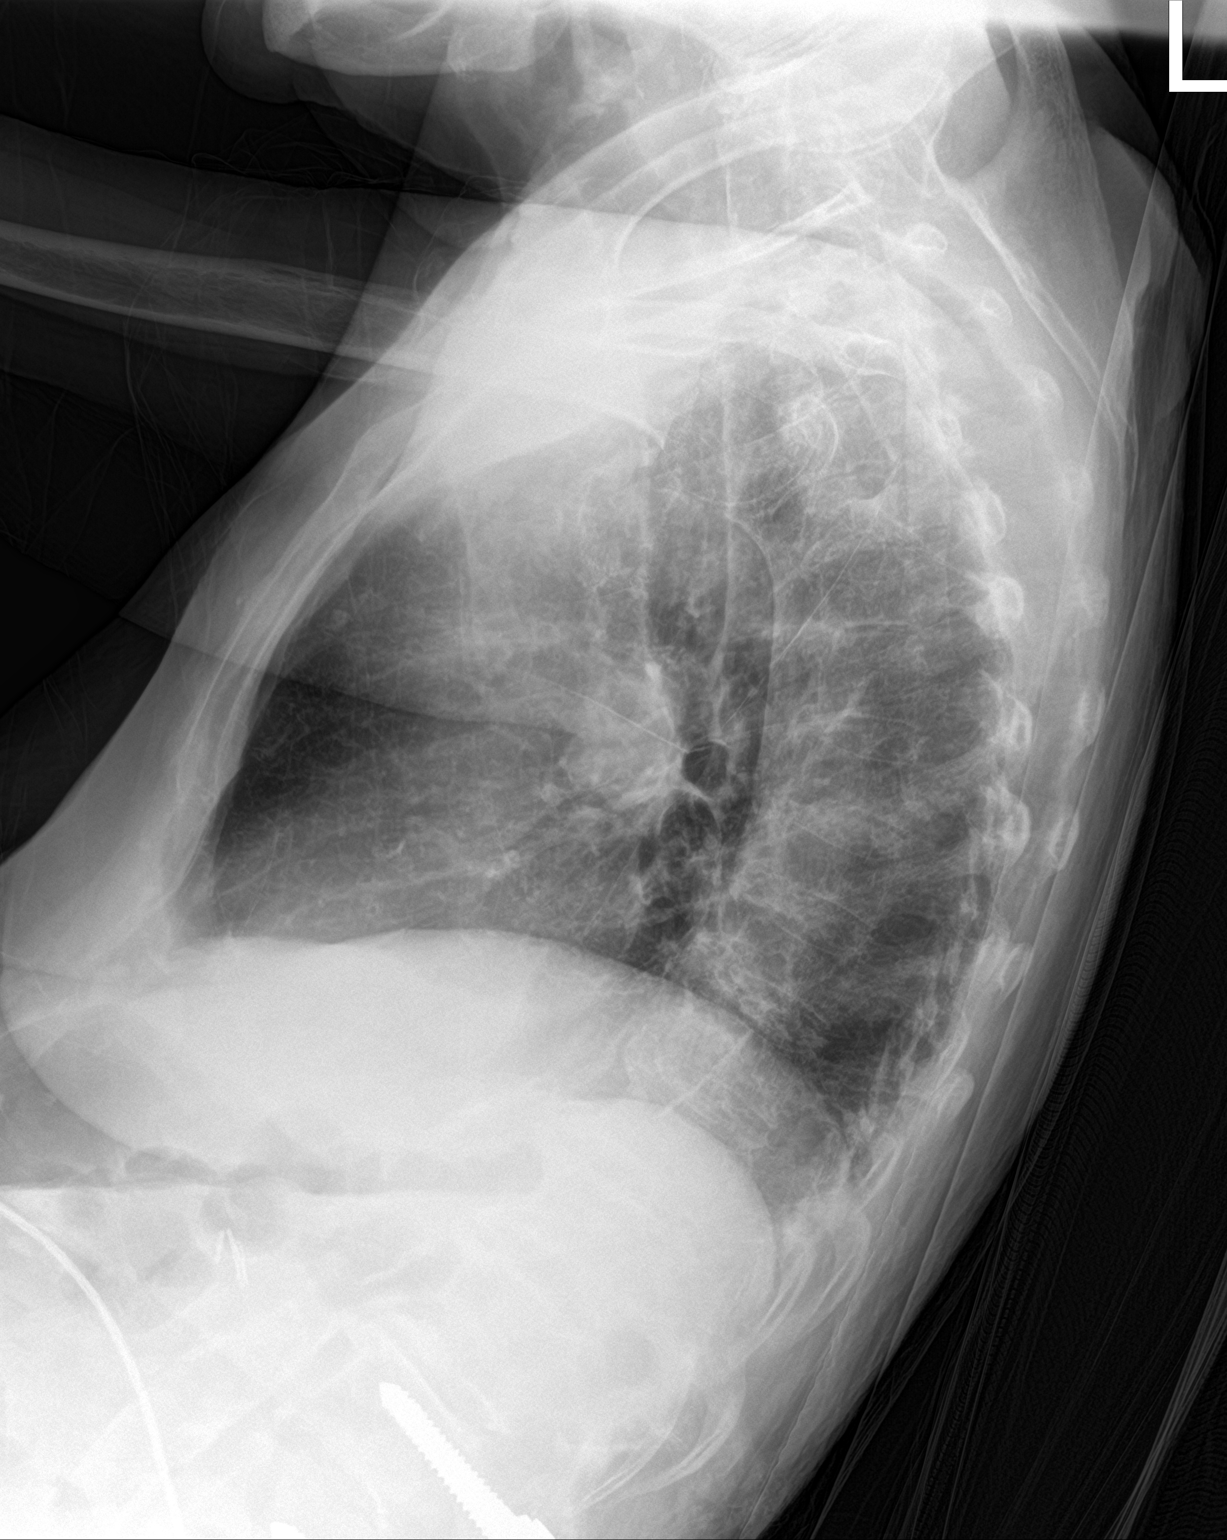
[im 2/2]
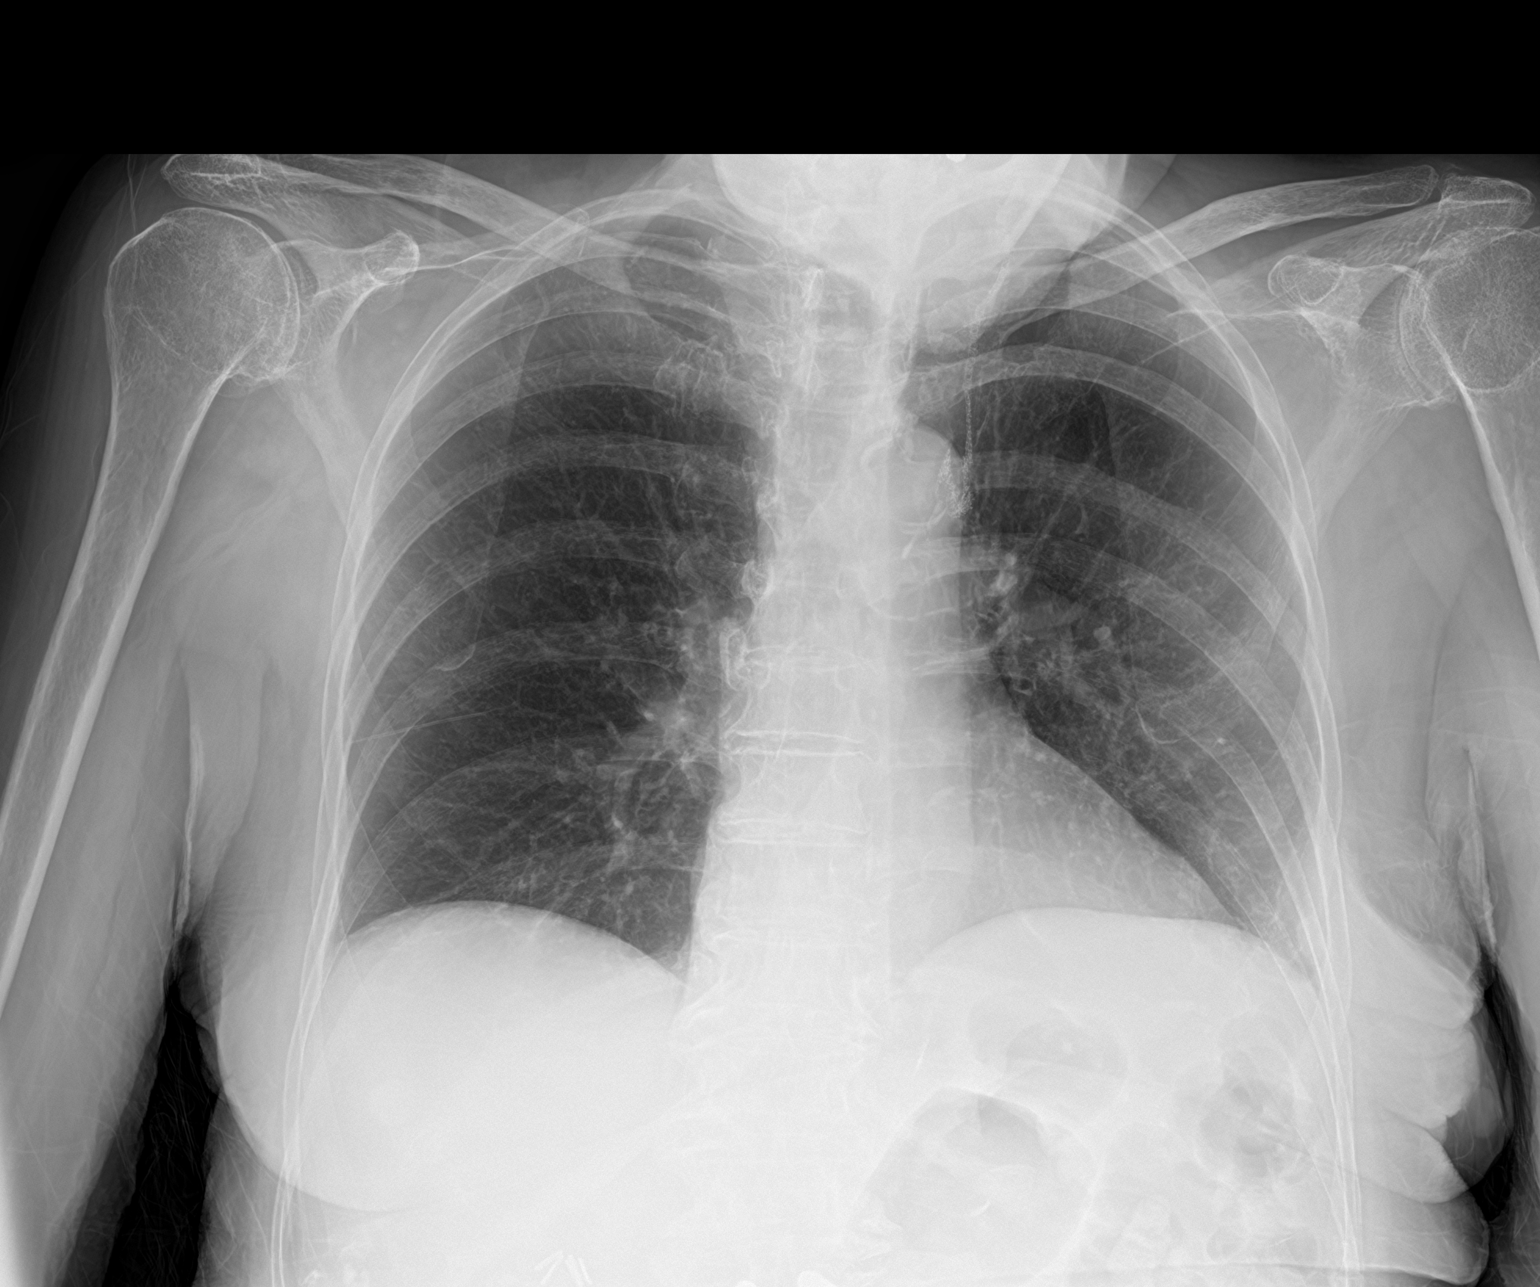

[2 of 2 positions shown; findings below may reference images not displayed]

FINDINGS: Slightly low lung volumes with obscure a shin of the left lung apex
due to patient's chin. Mild attenuation of the pulmonary vasculature
may reflect a component emphysematous disease. No alveolar
consolidation or CHF. No effusion or pneumothorax. Top-normal heart
size with aortic atherosclerosis. Osteoarthritis of the included AC
and glenohumeral joints.
IMPRESSION: No active cardiopulmonary disease. Aortic atherosclerosis.

## 2019-04-24 DIAGNOSIS — B351 Tinea unguium: Secondary | ICD-10-CM

## 2019-05-01 DIAGNOSIS — L03116 Cellulitis of left lower limb: Secondary | ICD-10-CM

## 2019-05-04 DIAGNOSIS — R21 Rash and other nonspecific skin eruption: Secondary | ICD-10-CM

## 2019-05-07 DIAGNOSIS — F0390 Unspecified dementia without behavioral disturbance: Secondary | ICD-10-CM | POA: Diagnosis not present

## 2019-05-07 DIAGNOSIS — Y844 Aspiration of fluid as the cause of abnormal reaction of the patient, or of later complication, without mention of misadventure at the time of the procedure: Secondary | ICD-10-CM | POA: Diagnosis not present

## 2019-05-21 DIAGNOSIS — M159 Polyosteoarthritis, unspecified: Secondary | ICD-10-CM

## 2019-05-21 DIAGNOSIS — F39 Unspecified mood [affective] disorder: Secondary | ICD-10-CM | POA: Diagnosis not present

## 2019-05-21 DIAGNOSIS — I10 Essential (primary) hypertension: Secondary | ICD-10-CM

## 2019-05-21 DIAGNOSIS — J439 Emphysema, unspecified: Secondary | ICD-10-CM | POA: Diagnosis not present

## 2019-05-21 DIAGNOSIS — G301 Alzheimer's disease with late onset: Secondary | ICD-10-CM | POA: Diagnosis not present

## 2019-05-25 DIAGNOSIS — L97319 Non-pressure chronic ulcer of right ankle with unspecified severity: Secondary | ICD-10-CM

## 2019-06-30 DIAGNOSIS — S80822A Blister (nonthermal), left lower leg, initial encounter: Secondary | ICD-10-CM | POA: Diagnosis not present

## 2019-07-17 DIAGNOSIS — J449 Chronic obstructive pulmonary disease, unspecified: Secondary | ICD-10-CM

## 2019-07-17 DIAGNOSIS — F39 Unspecified mood [affective] disorder: Secondary | ICD-10-CM | POA: Diagnosis not present

## 2019-07-17 DIAGNOSIS — N399 Disorder of urinary system, unspecified: Secondary | ICD-10-CM

## 2019-07-17 DIAGNOSIS — M1990 Unspecified osteoarthritis, unspecified site: Secondary | ICD-10-CM | POA: Diagnosis not present

## 2019-07-17 DIAGNOSIS — G309 Alzheimer's disease, unspecified: Secondary | ICD-10-CM | POA: Diagnosis not present

## 2019-08-02 DEATH — deceased
